# Patient Record
Sex: Female | Born: 1975 | Race: Black or African American | Hispanic: No | Marital: Married | Smoking: Never smoker
Health system: Southern US, Community
[De-identification: ages and names within clinical notes are randomized; demographics above are authoritative.]

## PROBLEM LIST (undated history)

## (undated) ENCOUNTER — Inpatient Hospital Stay (HOSPITAL_COMMUNITY): Payer: Self-pay

## (undated) DIAGNOSIS — Z789 Other specified health status: Secondary | ICD-10-CM

## (undated) DIAGNOSIS — B999 Unspecified infectious disease: Secondary | ICD-10-CM

## (undated) HISTORY — DX: Unspecified infectious disease: B99.9

---

## 2009-03-26 DIAGNOSIS — B999 Unspecified infectious disease: Secondary | ICD-10-CM

## 2009-03-26 HISTORY — DX: Unspecified infectious disease: B99.9

## 2009-09-28 ENCOUNTER — Ambulatory Visit (HOSPITAL_COMMUNITY): Admission: RE | Admit: 2009-09-28 | Discharge: 2009-09-28 | Payer: Self-pay | Admitting: Obstetrics & Gynecology

## 2010-02-24 ENCOUNTER — Ambulatory Visit (HOSPITAL_COMMUNITY)
Admission: RE | Admit: 2010-02-24 | Discharge: 2010-02-24 | Payer: Self-pay | Source: Home / Self Care | Admitting: Obstetrics & Gynecology

## 2010-03-06 ENCOUNTER — Ambulatory Visit (HOSPITAL_COMMUNITY)
Admission: RE | Admit: 2010-03-06 | Discharge: 2010-03-06 | Payer: Self-pay | Source: Home / Self Care | Attending: Obstetrics & Gynecology | Admitting: Obstetrics & Gynecology

## 2010-03-07 ENCOUNTER — Inpatient Hospital Stay (HOSPITAL_COMMUNITY)
Admission: AD | Admit: 2010-03-07 | Discharge: 2010-03-12 | Payer: Self-pay | Source: Home / Self Care | Attending: Obstetrics & Gynecology | Admitting: Obstetrics & Gynecology

## 2010-03-12 ENCOUNTER — Emergency Department (HOSPITAL_COMMUNITY)
Admission: EM | Admit: 2010-03-12 | Discharge: 2010-03-12 | Disposition: A | Discharge status: Other Acute Inpatient Hospital | Payer: Self-pay | Source: Home / Self Care | Admitting: Emergency Medicine

## 2010-03-12 ENCOUNTER — Inpatient Hospital Stay (HOSPITAL_COMMUNITY)
Admission: AD | Admit: 2010-03-12 | Discharge: 2010-03-15 | Payer: Self-pay | Attending: Obstetrics | Admitting: Obstetrics

## 2010-04-16 ENCOUNTER — Encounter: Payer: Self-pay | Admitting: Obstetrics & Gynecology

## 2010-06-05 LAB — CBC
HCT: 33.1 % — ABNORMAL LOW (ref 36.0–46.0)
Hemoglobin: 11.6 g/dL — ABNORMAL LOW (ref 12.0–15.0)
MCH: 32.2 pg (ref 26.0–34.0)
MCHC: 34 g/dL (ref 30.0–36.0)
MCHC: 33.5 g/dL (ref 30.0–36.0)
MCV: 94.7 fL (ref 78.0–100.0)
MCV: 95.4 fL (ref 78.0–100.0)
RBC: 3.6 MIL/uL — ABNORMAL LOW (ref 3.87–5.11)
RDW: 13.4 % (ref 11.5–15.5)
WBC: 12.5 10*3/uL — ABNORMAL HIGH (ref 4.0–10.5)
WBC: 4.8 10*3/uL (ref 4.0–10.5)

## 2010-06-05 LAB — URINE MICROSCOPIC-ADD ON

## 2010-06-05 LAB — GC/CHLAMYDIA PROBE AMP, GENITAL: Chlamydia, DNA Probe: NEGATIVE

## 2010-06-05 LAB — PROTIME-INR
INR: 1.06 (ref 0.00–1.49)
Prothrombin Time: 14 seconds (ref 11.6–15.2)

## 2010-06-05 LAB — URINE CULTURE: Culture  Setup Time: 201112190005

## 2010-06-05 LAB — CULTURE, BLOOD (ROUTINE X 2)

## 2010-06-05 LAB — COMPREHENSIVE METABOLIC PANEL
ALT: 55 U/L — ABNORMAL HIGH (ref 0–35)
AST: 90 U/L — ABNORMAL HIGH (ref 0–37)
Albumin: 2.2 g/dL — ABNORMAL LOW (ref 3.5–5.2)
CO2: 24 mEq/L (ref 19–32)
Chloride: 108 mEq/L (ref 96–112)
Creatinine, Ser: 0.69 mg/dL (ref 0.4–1.2)
Glucose, Bld: 86 mg/dL (ref 70–99)
Potassium: 3.6 mEq/L (ref 3.5–5.1)
Total Bilirubin: 0.8 mg/dL (ref 0.3–1.2)

## 2010-06-05 LAB — URINALYSIS, ROUTINE W REFLEX MICROSCOPIC
Bilirubin Urine: NEGATIVE
Ketones, ur: NEGATIVE mg/dL
Protein, ur: >300 mg/dL — AB

## 2010-06-05 LAB — DIFFERENTIAL
Basophils Absolute: 0 10*3/uL (ref 0.0–0.1)
Lymphocytes Relative: 13 % (ref 12–46)
Monocytes Absolute: 0.1 10*3/uL (ref 0.1–1.0)
Neutrophils Relative %: 84 % — ABNORMAL HIGH (ref 43–77)

## 2010-06-05 LAB — LACTIC ACID, PLASMA: Lactic Acid, Venous: 3.9 mmol/L — ABNORMAL HIGH (ref 0.5–2.2)

## 2010-06-05 LAB — WET PREP, GENITAL
Clue Cells Wet Prep HPF POC: NONE SEEN
Trich, Wet Prep: NONE SEEN

## 2010-06-06 LAB — COMPREHENSIVE METABOLIC PANEL
ALT: 13 U/L (ref 0–35)
AST: 21 U/L (ref 0–37)
Albumin: 3.3 g/dL — ABNORMAL LOW (ref 3.5–5.2)
Alkaline Phosphatase: 59 U/L (ref 39–117)
BUN: 6 mg/dL (ref 6–23)
CO2: 26 mEq/L (ref 19–32)
Chloride: 103 mEq/L (ref 96–112)
Creatinine, Ser: 0.6 mg/dL (ref 0.4–1.2)
Glucose, Bld: 76 mg/dL (ref 70–99)
Total Bilirubin: 0.8 mg/dL (ref 0.3–1.2)
Total Protein: 6.5 g/dL (ref 6.0–8.3)

## 2010-06-06 LAB — CBC
HCT: 39.2 % (ref 36.0–46.0)
HCT: 39.5 % (ref 36.0–46.0)
Hemoglobin: 13.4 g/dL (ref 12.0–15.0)
MCHC: 33.9 g/dL (ref 30.0–36.0)
MCV: 99.5 fL (ref 78.0–100.0)
Platelets: 234 10*3/uL (ref 150–400)
RBC: 3.94 MIL/uL (ref 3.87–5.11)
RBC: 3.97 MIL/uL (ref 3.87–5.11)
RDW: 13.5 % (ref 11.5–15.5)
WBC: 10.4 10*3/uL (ref 4.0–10.5)

## 2010-06-06 LAB — URINALYSIS, DIPSTICK ONLY
Bilirubin Urine: NEGATIVE
Nitrite: NEGATIVE
Specific Gravity, Urine: 1.01 (ref 1.005–1.030)
Urobilinogen, UA: 0.2 mg/dL (ref 0.0–1.0)
pH: 6.5 (ref 5.0–8.0)

## 2010-06-06 LAB — RPR: RPR Ser Ql: NONREACTIVE

## 2010-06-06 LAB — URIC ACID: Uric Acid, Serum: 3.7 mg/dL (ref 2.4–7.0)

## 2011-07-23 ENCOUNTER — Other Ambulatory Visit (HOSPITAL_COMMUNITY)
Admission: RE | Admit: 2011-07-23 | Discharge: 2011-07-23 | Disposition: A | Discharge status: Home / Self Care | Payer: 59 | Source: Ambulatory Visit | Attending: Family Medicine | Admitting: Family Medicine

## 2011-07-23 DIAGNOSIS — Z124 Encounter for screening for malignant neoplasm of cervix: Secondary | ICD-10-CM | POA: Insufficient documentation

## 2011-07-23 DIAGNOSIS — Z1159 Encounter for screening for other viral diseases: Secondary | ICD-10-CM | POA: Insufficient documentation

## 2011-09-06 ENCOUNTER — Encounter: Payer: Self-pay | Admitting: Obstetrics and Gynecology

## 2011-11-09 ENCOUNTER — Encounter: Payer: 59 | Admitting: Obstetrics and Gynecology

## 2011-11-13 ENCOUNTER — Encounter (HOSPITAL_COMMUNITY): Payer: Self-pay | Admitting: *Deleted

## 2011-11-13 ENCOUNTER — Inpatient Hospital Stay (HOSPITAL_COMMUNITY)
Admission: AD | Admit: 2011-11-13 | Discharge: 2011-11-13 | Disposition: A | Discharge status: Home / Self Care | Payer: 59 | Source: Ambulatory Visit | Attending: Obstetrics & Gynecology | Admitting: Obstetrics & Gynecology

## 2011-11-13 ENCOUNTER — Encounter: Payer: Self-pay | Admitting: Obstetrics and Gynecology

## 2011-11-13 ENCOUNTER — Inpatient Hospital Stay (HOSPITAL_COMMUNITY): Payer: 59

## 2011-11-13 DIAGNOSIS — O09529 Supervision of elderly multigravida, unspecified trimester: Secondary | ICD-10-CM | POA: Insufficient documentation

## 2011-11-13 DIAGNOSIS — Z98891 History of uterine scar from previous surgery: Secondary | ICD-10-CM | POA: Insufficient documentation

## 2011-11-13 DIAGNOSIS — O209 Hemorrhage in early pregnancy, unspecified: Secondary | ICD-10-CM | POA: Insufficient documentation

## 2011-11-13 DIAGNOSIS — O418X90 Other specified disorders of amniotic fluid and membranes, unspecified trimester, not applicable or unspecified: Secondary | ICD-10-CM

## 2011-11-13 HISTORY — DX: Other specified health status: Z78.9

## 2011-11-13 LAB — CBC
Platelets: 222 10*3/uL (ref 150–400)
RBC: 3.87 MIL/uL (ref 3.87–5.11)
RDW: 12.5 % (ref 11.5–15.5)
WBC: 7.9 10*3/uL (ref 4.0–10.5)

## 2011-11-13 LAB — POCT PREGNANCY, URINE: Preg Test, Ur: POSITIVE — AB

## 2011-11-13 LAB — HCG, QUANTITATIVE, PREGNANCY: hCG, Beta Chain, Quant, S: 68283 m[IU]/mL — ABNORMAL HIGH (ref <5)

## 2011-11-13 NOTE — MAU Note (Signed)
Was a work, felt a gush, no pain.  LMP 06/05.  First appt tomorrow at [facility].

## 2011-11-13 NOTE — MAU Provider Note (Signed)
History     CSN: [id number]  Arrival date and time: 11/13/11 0717   None     Chief Complaint  Patient presents with  . Vaginal Bleeding  . Threatened Miscarriage   HPI 36 y.o. G2P1001 at [redacted]w[redacted]d with vaginal bleeding this morning, was at work and felt gush of blood, no pain. Prior to today has had some intermittent episodes of pink spotting.     Past Medical History  Diagnosis Date  . No pertinent past medical history     Past Surgical History  Procedure Date  . Cesarean section     Family History  Problem Relation Age of Onset  . Other Neg Hx     History  Substance Use Topics  . Smoking status: Never Smoker   . Smokeless tobacco: Never Used  . Alcohol Use: No    Allergies: No Known Allergies  No prescriptions prior to admission    Review of Systems  Constitutional: Negative.   Respiratory: Negative.   Cardiovascular: Negative.   Gastrointestinal: Positive for nausea. Negative for vomiting, abdominal pain, diarrhea and constipation.  Genitourinary: Negative for dysuria, urgency, frequency, hematuria and flank pain.       Positive for vaginal bleeding   Musculoskeletal: Negative.   Neurological: Negative.   Psychiatric/Behavioral: Negative.    Physical Exam   Blood pressure 132/67, pulse 81, temperature 98.4 F (36.9 C), resp. rate 20, last menstrual period 08/29/2011.  Physical Exam  Nursing note and vitals reviewed. Constitutional: She is oriented to person, place, and time. She appears well-developed and well-nourished. No distress.  Cardiovascular: Normal rate.   Respiratory: Effort normal.  Genitourinary: There is bleeding (moderate) around the vagina.       Cervix long and closed   Musculoskeletal: Normal range of motion.  Neurological: She is alert and oriented to person, place, and time.  Skin: Skin is warm and dry.  Psychiatric: She has a normal mood and affect.    MAU Course  Procedures Results for orders placed during the  hospital encounter of 11/13/11 (from the past 24 hour(s))  POCT PREGNANCY, URINE     Status: Abnormal   Collection Time   11/13/11  7:32 AM      Component Value Range   Preg Test, Ur POSITIVE (*) NEGATIVE  CBC     Status: Abnormal   Collection Time   11/13/11  8:00 AM      Component Value Range   WBC 7.9  4.0 - 10.5 K/uL   RBC 3.87  3.87 - 5.11 MIL/uL   Hemoglobin 12.3  12.0 - 15.0 g/dL   HCT 40.9 (*) 81.1 - 91.4 %   MCV 92.8  78.0 - 100.0 fL   MCH 31.8  26.0 - 34.0 pg   MCHC 34.3  30.0 - 36.0 g/dL   RDW 78.2  95.6 - 21.3 %   Platelets 222  150 - 400 K/uL  ABO/RH     Status: Normal   Collection Time   11/13/11  8:00 AM      Component Value Range   ABO/RH(D) O POS    HCG, QUANTITATIVE, PREGNANCY     Status: Abnormal   Collection Time   11/13/11  8:00 AM      Component Value Range   hCG, Beta Chain, Quant, S 08657 (*) <5 mIU/mL   U/S: 11.5 wk IUP, FHR 170, + subchorionic hemorrhage measuring 4 cm  Assessment and Plan  36 y.o. G2P1001 at [redacted]w[redacted]d Bleeding r/t subchorionic hemorrhage Rev'd  precautions, pelvic rest F/U at scheduled new OB appt tomorrow at [facility] 11/13/2011, 9:53 AM

## 2011-11-13 NOTE — MAU Note (Signed)
Assisted to bathroom from lobby via wc.  Pt reports a 'gush' this morning.  Has appt for first pnv scheduled- is "2wks".

## 2011-11-13 NOTE — MAU Provider Note (Signed)
Attestation of Attending Supervision of Advanced Practitioner (CNM/NP): Evaluation and management procedures were performed by the Advanced Practitioner under my supervision and collaboration.  I have reviewed the Advanced Practitioner's note and chart, and I agree with the management and plan.  Dreonna Hussein, MD, FACOG Attending Obstetrician & Gynecologist Faculty Practice, [facility], [place]  

## 2011-11-14 ENCOUNTER — Ambulatory Visit (INDEPENDENT_AMBULATORY_CARE_PROVIDER_SITE_OTHER): Payer: 59 | Admitting: Obstetrics and Gynecology

## 2011-11-14 ENCOUNTER — Encounter: Payer: Self-pay | Admitting: Obstetrics and Gynecology

## 2011-11-14 DIAGNOSIS — Z331 Pregnant state, incidental: Secondary | ICD-10-CM

## 2011-11-14 DIAGNOSIS — Z3201 Encounter for pregnancy test, result positive: Secondary | ICD-10-CM

## 2011-11-14 LAB — OB RESULTS CONSOLE GBS
GBS: NEGATIVE
GBS: POSITIVE

## 2011-11-15 LAB — PRENATAL PANEL VII
Antibody Screen: NEGATIVE
Basophils Absolute: 0 10*3/uL (ref 0.0–0.1)
Eosinophils Relative: 0 % (ref 0–5)
HIV: NONREACTIVE
Hepatitis B Surface Ag: NEGATIVE
Lymphocytes Relative: 23 % (ref 12–46)
Lymphs Abs: 1.8 10*3/uL (ref 0.7–4.0)
Neutro Abs: 5.7 10*3/uL (ref 1.7–7.7)
Neutrophils Relative %: 71 % (ref 43–77)
Platelets: 246 10*3/uL (ref 150–400)
RBC: 3.6 MIL/uL — ABNORMAL LOW (ref 3.87–5.11)
RDW: 13.6 % (ref 11.5–15.5)
WBC: 8.1 10*3/uL (ref 4.0–10.5)

## 2011-11-16 ENCOUNTER — Encounter: Payer: Self-pay | Admitting: Obstetrics and Gynecology

## 2011-11-16 DIAGNOSIS — O2340 Unspecified infection of urinary tract in pregnancy, unspecified trimester: Secondary | ICD-10-CM | POA: Insufficient documentation

## 2011-11-16 DIAGNOSIS — B951 Streptococcus, group B, as the cause of diseases classified elsewhere: Secondary | ICD-10-CM | POA: Insufficient documentation

## 2011-11-16 LAB — HEMOGLOBINOPATHY EVALUATION
Hemoglobin Other: 0 %
Hgb F Quant: 3.3 % — ABNORMAL HIGH (ref 0.0–2.0)
Hgb S Quant: 0 %

## 2011-11-16 LAB — CULTURE, OB URINE

## 2011-11-19 ENCOUNTER — Ambulatory Visit (INDEPENDENT_AMBULATORY_CARE_PROVIDER_SITE_OTHER): Payer: 59 | Admitting: Obstetrics and Gynecology

## 2011-11-19 ENCOUNTER — Encounter: Payer: Self-pay | Admitting: Obstetrics and Gynecology

## 2011-11-19 VITALS — BP 110/62 | Wt 192.0 lb

## 2011-11-19 DIAGNOSIS — B951 Streptococcus, group B, as the cause of diseases classified elsewhere: Secondary | ICD-10-CM

## 2011-11-19 DIAGNOSIS — Z331 Pregnant state, incidental: Secondary | ICD-10-CM

## 2011-11-19 DIAGNOSIS — Z98891 History of uterine scar from previous surgery: Secondary | ICD-10-CM

## 2011-11-19 DIAGNOSIS — O09529 Supervision of elderly multigravida, unspecified trimester: Secondary | ICD-10-CM

## 2011-11-19 DIAGNOSIS — Z9889 Other specified postprocedural states: Secondary | ICD-10-CM

## 2011-11-19 DIAGNOSIS — A491 Streptococcal infection, unspecified site: Secondary | ICD-10-CM

## 2011-11-19 DIAGNOSIS — Z113 Encounter for screening for infections with a predominantly sexual mode of transmission: Secondary | ICD-10-CM

## 2011-11-19 LAB — POCT URINALYSIS DIPSTICK
Bilirubin, UA: NEGATIVE
Nitrite, UA: NEGATIVE
pH, UA: 6

## 2011-11-19 MED ORDER — PENICILLIN V POTASSIUM 500 MG PO TABS: 500.0000 mg | ORAL_TABLET | Freq: Three times a day (TID) | ORAL | Status: AC

## 2011-11-19 NOTE — Progress Notes (Signed)
Pt c/o nausea and vomitting. Also has a brownish vag d/c. Angela Phelps

## 2011-11-20 ENCOUNTER — Encounter: Payer: Self-pay | Admitting: Obstetrics and Gynecology

## 2011-11-20 NOTE — Progress Notes (Signed)
Patient ID: Angela Phelps, female   DOB: 10-Sep-1975, 36 y.o.   MRN: [id number]  SUKHMANI Phelps is here for a new obstetrical visit.  Denies vag bleeding, taking pnv, no N,V, declines genetic testing. [redacted]w[redacted]d Problem list: Patient Active Problem List  Diagnosis  . H/O cesarean section  . AMA (advanced maternal age) multigravida 35+  . GBS (group B streptococcus) UTI complicating pregnancy  . Large for gestational age (LGA)    @IPILAPH @ OB History    Grav Para Term Preterm Abortions TAB SAB Ect Mult Living   2 1 1  0 0 0 0 0 0 1     Past Medical History  Diagnosis Date  . No pertinent past medical history   . Infection 2011    UTI after C/S   . Infection 2011    Yeast inf;during last prenancy   Past Surgical History  Procedure Date  . Cesarean section 2011    d/t failure to progress after 9 cm   Family History: family history is negative for Other. Social History:  reports that she has never smoked. She has never used smokeless tobacco. She reports that she does not drink alcohol or use illicit drugs.    Blood pressure 110/62, weight 192 lb (87.091 kg), last menstrual period 08/29/2011, unknown if currently breastfeeding.  Physical exam: Calm, no distress Alert, appropriate appearance for age. No acute distress HEENT Grossly normal Thyroid no masses, nodes or enlargement  lungs clear bilaterally, AP RRR, breasts bilaterally no masses, dimpling, drainage, abd soft, gravid, nt, bowel sounds active no edema to lower extremities EGBUS and perineum wnl, normal hair distribution Vagina pink moist Cervix LTC no cerical motion tenderness, cervical polyp with scant blood noted White discharge appears adequate FHTS +  Prenatal labs: ABO, Rh: O/POS/-- (08/21 1444) Antibody: NEG (08/21 1444) Rubella:  immune RPR: NON REAC (08/21 1444)  HBsAg: NEGATIVE (08/21 1444)  HIV: NON REACTIVE (08/21 1444)  GBS:   + urine  Assessment/Plan: [redacted]w[redacted]d Hx subchorionic hemorrhage with  [place] 8/20 Wet prep: neg trich, hyphae neg, clue neg Pap: on 4/13 sent for record Genetic screen: declined +GBS urine RX Pen VK discussed with f/o UA C&S.  Collaboration with Dr. Normand Sloop. [facility], Tykia Mellone 11/20/2011, 4:46 PM Lavera Guise, CNM

## 2011-11-28 ENCOUNTER — Other Ambulatory Visit: Payer: Self-pay | Admitting: Obstetrics and Gynecology

## 2011-11-28 MED ORDER — CITRANATAL HARMONY 30-1-260 MG PO CAPS: 1.0000 | ORAL_CAPSULE | Freq: Every day | ORAL | Status: DC

## 2011-11-28 NOTE — Telephone Encounter (Signed)
Tc from pt per telephone call. Pt req rf for Citranatal [place]. Rx e-pres to pharm on file. Pt agrees.

## 2011-11-28 NOTE — Telephone Encounter (Signed)
Lm on vm to cb per telephone call.  

## 2011-11-28 NOTE — Telephone Encounter (Signed)
TRIAGE/RX REQ °

## 2011-12-17 ENCOUNTER — Ambulatory Visit (INDEPENDENT_AMBULATORY_CARE_PROVIDER_SITE_OTHER): Payer: 59 | Admitting: Obstetrics and Gynecology

## 2011-12-17 ENCOUNTER — Encounter: Payer: Self-pay | Admitting: Obstetrics and Gynecology

## 2011-12-17 VITALS — BP 116/70 | Wt 193.0 lb

## 2011-12-17 DIAGNOSIS — Z331 Pregnant state, incidental: Secondary | ICD-10-CM

## 2011-12-17 NOTE — Progress Notes (Signed)
[redacted]w[redacted]d Pt states no problems today.

## 2011-12-17 NOTE — Progress Notes (Signed)
[redacted]w[redacted]d No c/o Has some N/V but not frequent, requests generic for PNV, also could take OTC PNV  rv'd nutrition  RTO 4wks anatomy scan

## 2011-12-17 NOTE — Patient Instructions (Signed)
Preventing Preterm Labor Preterm labor is when a pregnant woman has contractions that cause the cervix to open, shorten, and thin before 37 weeks of pregnancy. You will have regular contractions (tightening) 2 to 3 minutes apart. This usually causes discomfort or pain. HOME CARE  Eat a healthy diet.   Take your vitamins as told by your doctor.   Drink enough fluids to keep your pee (urine) clear or pale yellow every day.   Get rest and sleep.   Do not have sex if you are at high risk for preterm labor.   Follow your doctor's advice about activity, medicines, and tests.   Avoid stress.   Avoid hard labor or exercise that lasts for a long time.   Do not smoke.  GET HELP RIGHT AWAY IF:   You are having contractions.   You have belly (abdominal) pain.   You have bleeding from your vagina.   You have pain when you pee (urinate).   You have abnormal discharge from your vagina.   You have a temperature by mouth above 102 F (38.9 C).  MAKE SURE YOU:  Understand these instructions.   Will watch your condition.   Will get help if you are not doing well or get worse.  Document Released: 06/08/2008 Document Revised: 03/01/2011 Document Reviewed: 06/08/2008 [employer] Patient Information 2012 [employer]. 

## 2012-01-14 ENCOUNTER — Other Ambulatory Visit: Payer: Self-pay | Admitting: Obstetrics and Gynecology

## 2012-01-14 ENCOUNTER — Ambulatory Visit (INDEPENDENT_AMBULATORY_CARE_PROVIDER_SITE_OTHER): Payer: 59 | Admitting: Obstetrics and Gynecology

## 2012-01-14 ENCOUNTER — Encounter: Payer: Self-pay | Admitting: Obstetrics and Gynecology

## 2012-01-14 ENCOUNTER — Ambulatory Visit (INDEPENDENT_AMBULATORY_CARE_PROVIDER_SITE_OTHER): Payer: 59

## 2012-01-14 VITALS — BP 112/72 | Ht 64.0 in | Wt 200.0 lb

## 2012-01-14 DIAGNOSIS — Z3689 Encounter for other specified antenatal screening: Secondary | ICD-10-CM

## 2012-01-14 DIAGNOSIS — O4420 Partial placenta previa NOS or without hemorrhage, unspecified trimester: Secondary | ICD-10-CM | POA: Insufficient documentation

## 2012-01-14 DIAGNOSIS — O09529 Supervision of elderly multigravida, unspecified trimester: Secondary | ICD-10-CM

## 2012-01-14 DIAGNOSIS — Z331 Pregnant state, incidental: Secondary | ICD-10-CM

## 2012-01-14 LAB — US OB DETAIL + 14 WK

## 2012-01-14 LAB — US OB TRANSVAGINAL

## 2012-01-14 LAB — CBC
Platelets: 265 10*3/uL (ref 150–400)
RBC: 3.54 MIL/uL — ABNORMAL LOW (ref 3.87–5.11)
WBC: 6.2 10*3/uL (ref 4.0–10.5)

## 2012-01-14 NOTE — Progress Notes (Signed)
Pt stated no issues today.  [redacted]w[redacted]d EFW(B,H,F) Hadi: 13 oz +/-0.92oz 53%tile (381grams) Vertex presentation Anterior plencenta. No marginal previa at this time . Suggest follow up at 2 weeks GAGA/EDD cx is closed normal ovaries

## 2012-01-14 NOTE — Progress Notes (Signed)
Patient ID: Angela Phelps, female   DOB: 06-10-1975, 36 y.o.   MRN: [id number] [redacted]w[redacted]d 1 gtt today F/o [place] 6 weeks Marginal previa discussed risks bleeding, no vag exam and no intercouse discussed. Bleeding to report. Lavera Guise, CNM

## 2012-01-15 LAB — GLUCOSE TOLERANCE, 1 HOUR (50G) W/O FASTING: Glucose, 1 Hour GTT: 123 mg/dL (ref 70–140)

## 2012-01-29 IMAGING — US US FETAL BPP W/O NONSTRESS
1 series · 6 of 6 positions shown · non-contrast
Comparison: none

[Series 1: us fetal bpp w/o nonstress · non-contrast · 6 acquisitions, 6 frames shown]
[im 1/6]
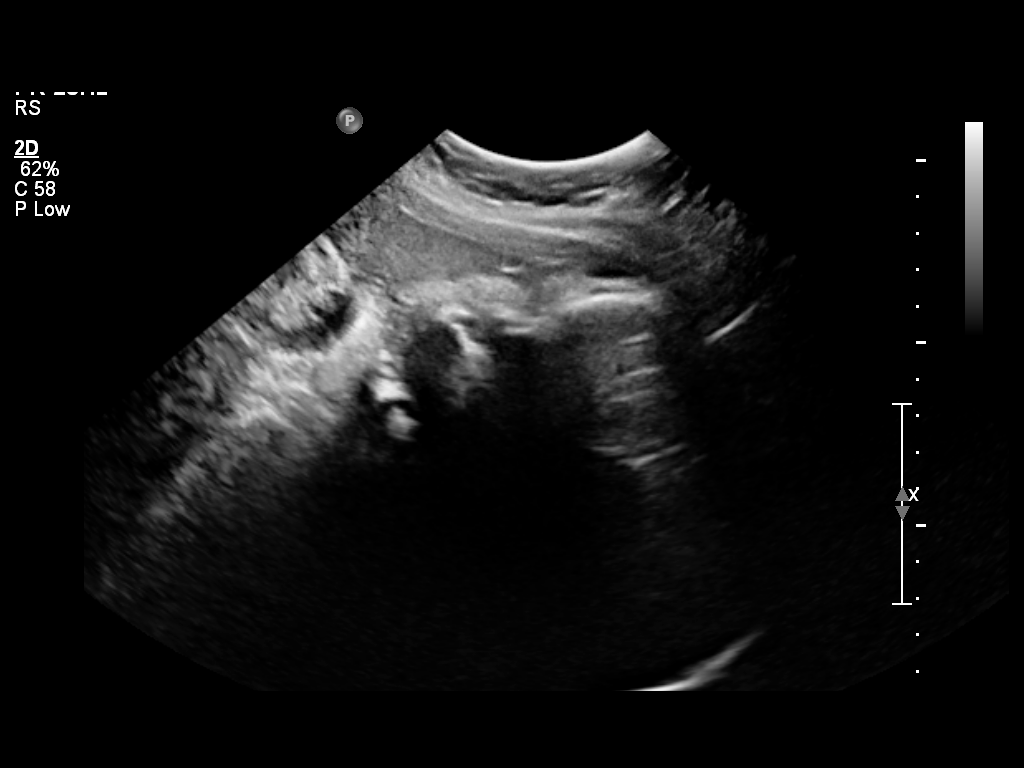
[im 2/6]
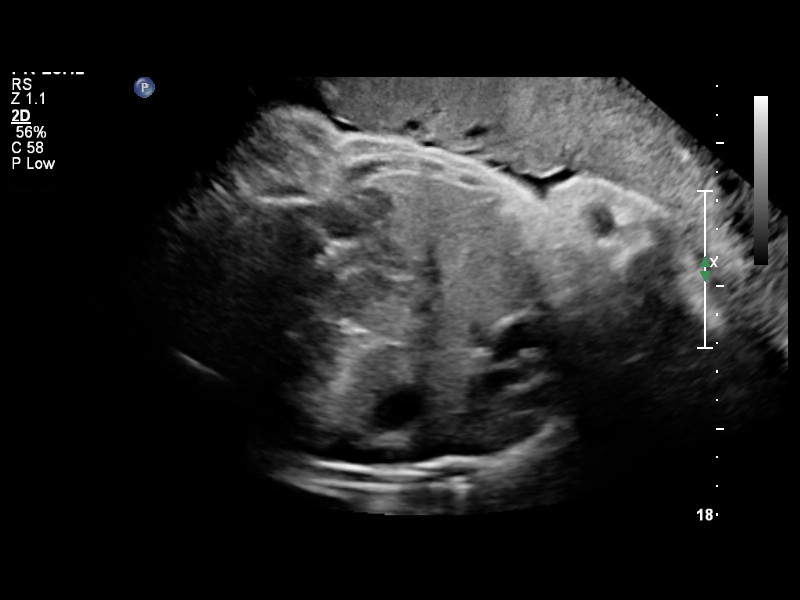
[im 3/6]
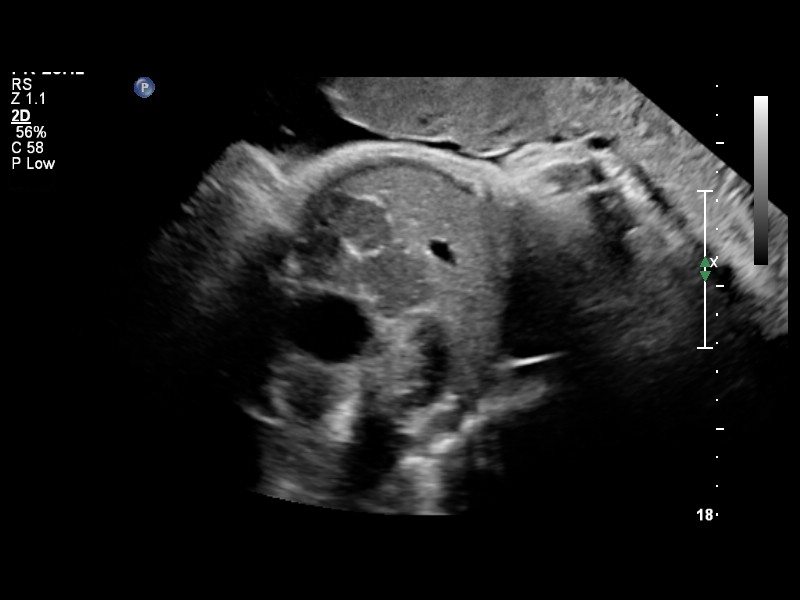
[im 4/6]
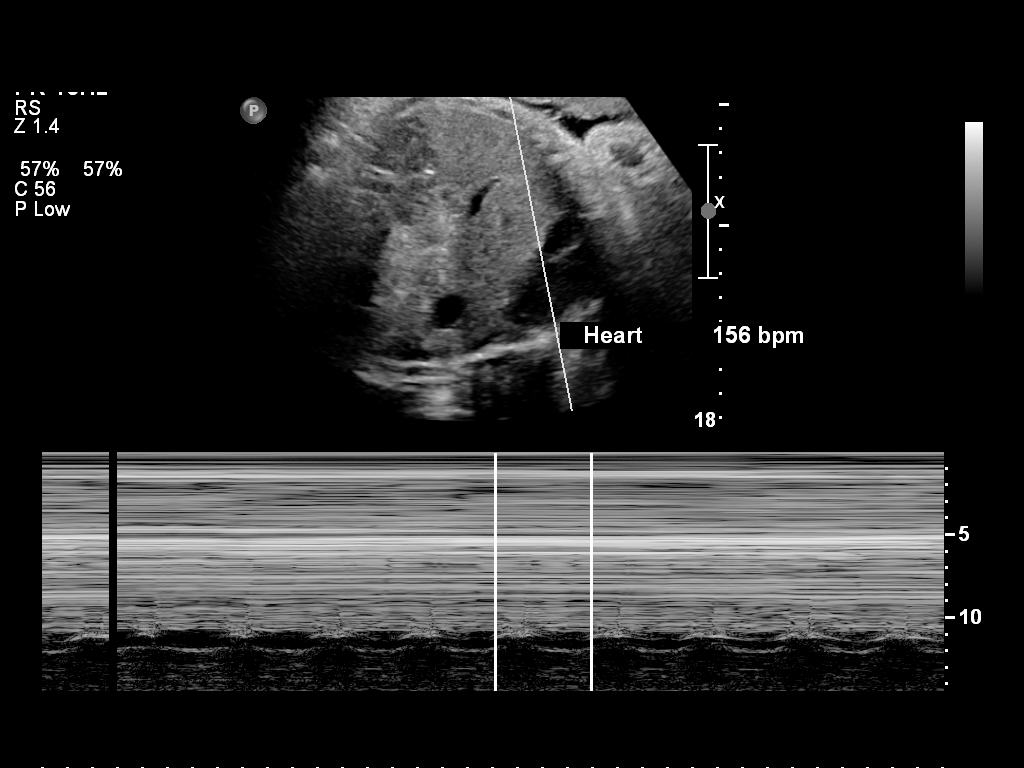
[im 5/6]
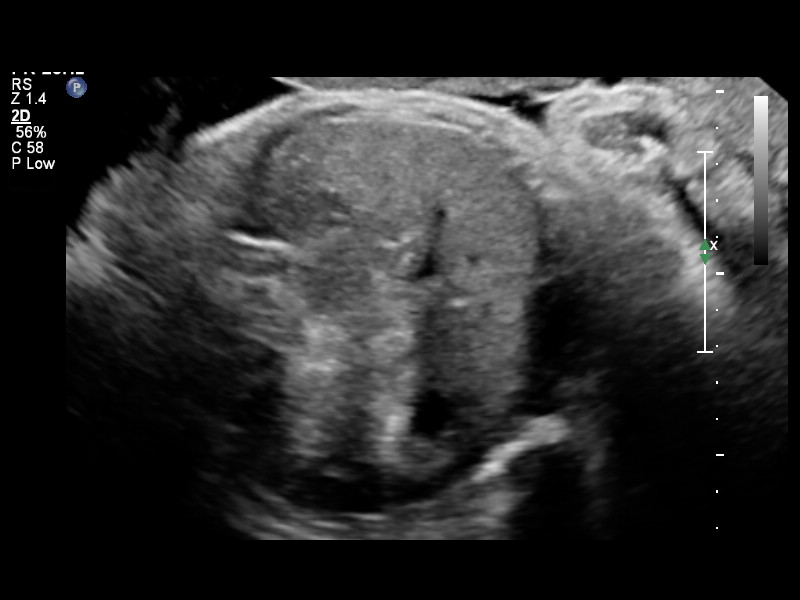
[im 6/6]
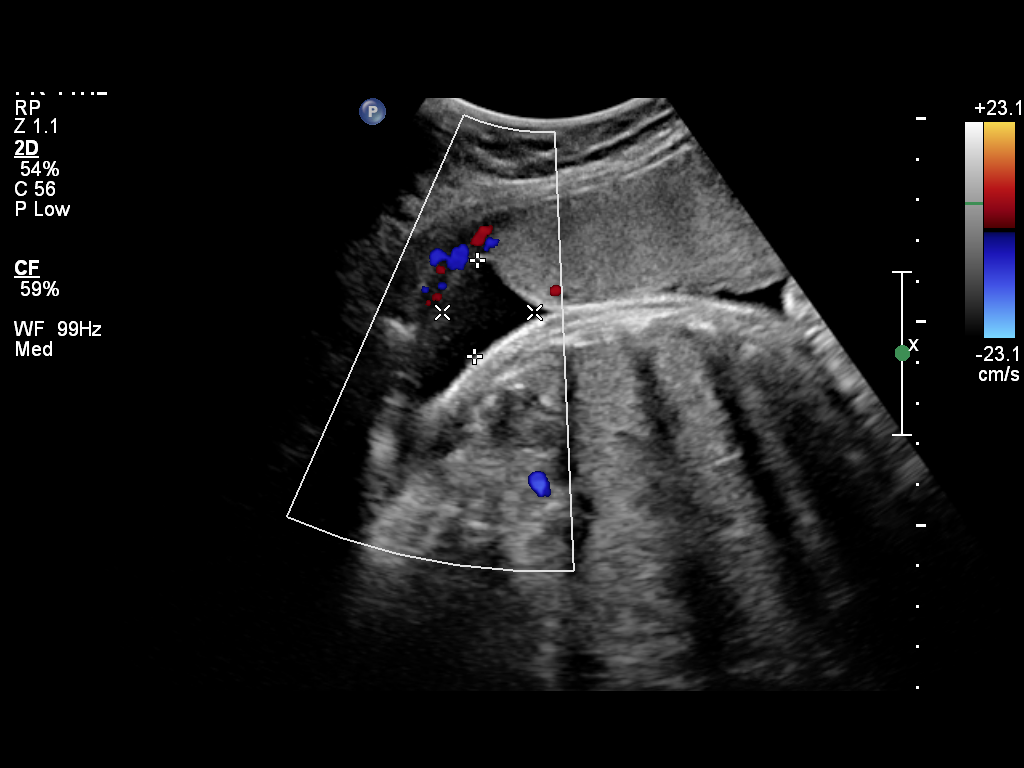

[6 of 6 positions shown; findings below may reference images not displayed]

OBSTETRICS REPORT
                      (Signed Final 03/06/2010 [DATE])

 Order#:         _O
Procedures

 US FETAL BPP W/O NONSTRESS                            76819.0
Indications

 Postdate pregnancy (40-42 weeks)
 Assess fetal well being
Fetal Evaluation

 Fetal Heart Rate:  156                          bpm
 Cardiac Activity:  Observed
 Presentation:      Cephalic

 Amniotic Fluid
 AFI FV:      Subjectively within normal limits
Biophysical Evaluation

 Amniotic F.V:   Pocket => 2 cm two         F. Tone:        Observed
                 planes
 F. Movement:    Observed                   Score:          [DATE]
 F. Breathing:   Observed
Gestational Age

 LMP:           41w 2d        Date:  05/21/09                 EDD:   02/25/10
 Clinical EDD:  41w 5d                                        EDD:   02/22/10
 Best:          41w 5d     Det. By:  Clinical EDD             EDD:   02/22/10
Impression

 Single living IUP with assigned GA of 41w 5d.
 BPP is [DATE].
 Normal amniotic fluid volume.

## 2012-02-11 ENCOUNTER — Ambulatory Visit (INDEPENDENT_AMBULATORY_CARE_PROVIDER_SITE_OTHER): Payer: 59 | Admitting: Obstetrics and Gynecology

## 2012-02-11 VITALS — BP 110/62 | Wt 201.0 lb

## 2012-02-11 DIAGNOSIS — Z98891 History of uterine scar from previous surgery: Secondary | ICD-10-CM

## 2012-02-11 DIAGNOSIS — Z9889 Other specified postprocedural states: Secondary | ICD-10-CM

## 2012-02-11 DIAGNOSIS — Z331 Pregnant state, incidental: Secondary | ICD-10-CM

## 2012-02-11 DIAGNOSIS — O09529 Supervision of elderly multigravida, unspecified trimester: Secondary | ICD-10-CM

## 2012-02-11 DIAGNOSIS — B951 Streptococcus, group B, as the cause of diseases classified elsewhere: Secondary | ICD-10-CM

## 2012-02-11 DIAGNOSIS — Z3490 Encounter for supervision of normal pregnancy, unspecified, unspecified trimester: Secondary | ICD-10-CM

## 2012-02-11 DIAGNOSIS — Z23 Encounter for immunization: Secondary | ICD-10-CM

## 2012-02-11 DIAGNOSIS — O2390 Unspecified genitourinary tract infection in pregnancy, unspecified trimester: Secondary | ICD-10-CM

## 2012-02-11 DIAGNOSIS — N390 Urinary tract infection, site not specified: Secondary | ICD-10-CM

## 2012-02-11 NOTE — Progress Notes (Signed)
[redacted]w[redacted]d Pt complains if right sided pain off and on.  Maybe every other day.  Had single episode of vomiting yesterday, none today.  Call for perisitent pain 1hr glucola done on 01/14/2012--normal at 123/rpr non reactive/. Repeat U/S at [facility] to followup placental placement Repeat 1 hr glucola at 30 wks VBAC consent given, pt to read and return it at [facility] Flu vaccine today

## 2012-02-27 ENCOUNTER — Telehealth: Payer: Self-pay | Admitting: Obstetrics and Gynecology

## 2012-02-28 NOTE — Telephone Encounter (Signed)
Tc to pt regarding msg.  Lm on vm to call back. 

## 2012-02-29 ENCOUNTER — Telehealth: Payer: Self-pay | Admitting: Obstetrics and Gynecology

## 2012-02-29 NOTE — Telephone Encounter (Signed)
Spoke with pt rgd msg pt wants to know what she can take for a cough informed pt can take plain robitussin,plain mucinexs, or plain delsym pt voice understanding

## 2012-03-07 ENCOUNTER — Other Ambulatory Visit: Payer: Self-pay

## 2012-03-07 DIAGNOSIS — O26849 Uterine size-date discrepancy, unspecified trimester: Secondary | ICD-10-CM

## 2012-03-10 ENCOUNTER — Ambulatory Visit (INDEPENDENT_AMBULATORY_CARE_PROVIDER_SITE_OTHER): Payer: 59 | Admitting: Obstetrics and Gynecology

## 2012-03-10 ENCOUNTER — Encounter: Payer: Self-pay | Admitting: Obstetrics and Gynecology

## 2012-03-10 ENCOUNTER — Ambulatory Visit (INDEPENDENT_AMBULATORY_CARE_PROVIDER_SITE_OTHER): Payer: 59

## 2012-03-10 VITALS — BP 110/60 | Wt 208.0 lb

## 2012-03-10 DIAGNOSIS — O4420 Partial placenta previa NOS or without hemorrhage, unspecified trimester: Secondary | ICD-10-CM

## 2012-03-10 DIAGNOSIS — O4410 Placenta previa with hemorrhage, unspecified trimester: Secondary | ICD-10-CM

## 2012-03-10 DIAGNOSIS — O26849 Uterine size-date discrepancy, unspecified trimester: Secondary | ICD-10-CM

## 2012-03-10 DIAGNOSIS — Z9889 Other specified postprocedural states: Secondary | ICD-10-CM

## 2012-03-10 DIAGNOSIS — Z98891 History of uterine scar from previous surgery: Secondary | ICD-10-CM

## 2012-03-10 NOTE — Progress Notes (Signed)
[redacted]w[redacted]d No complaints H/o Prim LTCS with 2 layer closure wants VBAC - consent signed today R/B/A reviewed U/S 2lbs 13oz 76.5%, AFI 16.49cm, low lying placenta 1.2 cm from internal os - rec repeat at 32-34wks RTO 2wks and repeat glucola that day

## 2012-03-17 LAB — US OB FOLLOW UP

## 2012-03-26 NOTE — L&D Delivery Note (Signed)
Delivery Note  At 5:55 PM a healthy female was delivered via VBAC, Spontaneous (Presentation: Left Occiput Anterior).  APGAR: 8, 9; weight 6 lb 14.6 oz (3135 g).   Placenta status: Intact, Spontaneous.  Cord: 3 vessels with the following complications: None.  Anesthesia: None  Episiotomy: None Lacerations: 3rd degree Suture Repair: 2.0 vicryl rapide Est. Blood Loss (mL): 400   Mom to postpartum.   Baby A to skin to skin.   Baby B to NA.  Kirkland Hun V 05/14/2012, 6:46 PM

## 2012-03-31 ENCOUNTER — Encounter: Payer: Self-pay | Admitting: Obstetrics and Gynecology

## 2012-03-31 ENCOUNTER — Other Ambulatory Visit: Payer: Self-pay

## 2012-03-31 ENCOUNTER — Other Ambulatory Visit: Payer: 59

## 2012-03-31 ENCOUNTER — Ambulatory Visit (INDEPENDENT_AMBULATORY_CARE_PROVIDER_SITE_OTHER): Payer: 59 | Admitting: Obstetrics and Gynecology

## 2012-03-31 VITALS — BP 108/70 | Wt 210.0 lb

## 2012-03-31 DIAGNOSIS — O09529 Supervision of elderly multigravida, unspecified trimester: Secondary | ICD-10-CM

## 2012-03-31 DIAGNOSIS — Z3480 Encounter for supervision of other normal pregnancy, unspecified trimester: Secondary | ICD-10-CM

## 2012-03-31 DIAGNOSIS — O4440 Low lying placenta NOS or without hemorrhage, unspecified trimester: Secondary | ICD-10-CM

## 2012-03-31 DIAGNOSIS — Z9889 Other specified postprocedural states: Secondary | ICD-10-CM

## 2012-03-31 DIAGNOSIS — Z98891 History of uterine scar from previous surgery: Secondary | ICD-10-CM

## 2012-03-31 DIAGNOSIS — O4410 Placenta previa with hemorrhage, unspecified trimester: Secondary | ICD-10-CM

## 2012-03-31 NOTE — Progress Notes (Signed)
[redacted]w[redacted]d U/s in 2wks to f/u low lying placenta and EFW secondary to LGA Glucola today FKCs RTO 2wks Occas RUQ discomfort with movement.  Likely MSK, notify office if worsens.

## 2012-04-15 ENCOUNTER — Ambulatory Visit: Payer: 59

## 2012-04-15 ENCOUNTER — Ambulatory Visit: Payer: 59 | Admitting: Obstetrics and Gynecology

## 2012-04-15 ENCOUNTER — Encounter: Payer: Self-pay | Admitting: Obstetrics and Gynecology

## 2012-04-15 VITALS — BP 112/74 | Wt 214.0 lb

## 2012-04-15 DIAGNOSIS — O4440 Low lying placenta NOS or without hemorrhage, unspecified trimester: Secondary | ICD-10-CM

## 2012-04-15 DIAGNOSIS — Z3490 Encounter for supervision of normal pregnancy, unspecified, unspecified trimester: Secondary | ICD-10-CM

## 2012-04-15 LAB — US OB FOLLOW UP

## 2012-04-15 NOTE — Progress Notes (Signed)
[redacted]w[redacted]d Pt c/o being more tired with this pregnancy, but otherwise feeling fine Discussed [place] results Low lying placenta has now resolved Discussed VBAC options -- consent has been signed Pt had questions about FMLA - told her to speak with HR at her work Will return in 2 weeks F/u US @ 38 weeks for growth - H/o of LGA

## 2012-04-15 NOTE — Progress Notes (Signed)
Pt stated had a cold last week been feeling much better. Pt stated no issues today.  Ultrasound shows:  SIUP  S=D     [place] EDD: 05/29/2012           EFW: 5 lb 0oz           Cervical length: 3.83 cm           Placenta localization: anterior           Fetal presentation: vertex                   Anatomy survey is normal           Gender : female Comments: Fluid is normal AFI =65th% Resolved low lying placenta (placental edge is 6.1 cm from internal os-normal. Normal ovaries,no fluid in cds, normal adenaxas.

## 2012-05-01 ENCOUNTER — Ambulatory Visit: Payer: 59 | Admitting: Obstetrics and Gynecology

## 2012-05-01 ENCOUNTER — Encounter: Payer: Self-pay | Admitting: Obstetrics and Gynecology

## 2012-05-01 VITALS — BP 90/68 | Wt 216.0 lb

## 2012-05-01 DIAGNOSIS — Z331 Pregnant state, incidental: Secondary | ICD-10-CM

## 2012-05-01 NOTE — Progress Notes (Signed)
[redacted]w[redacted]d +GBS via urine  No concerns today per pt

## 2012-05-01 NOTE — Progress Notes (Signed)
Reviewed previous US .  No change in decision for VBAC. Marland Kitchen Declines follow up US for EFW as it will not affect her decision. Vtx. By leopolds.

## 2012-05-07 ENCOUNTER — Encounter: Payer: 59 | Admitting: Obstetrics and Gynecology

## 2012-05-09 ENCOUNTER — Ambulatory Visit: Payer: 59 | Admitting: Obstetrics and Gynecology

## 2012-05-09 ENCOUNTER — Other Ambulatory Visit: Payer: Self-pay | Admitting: Obstetrics and Gynecology

## 2012-05-09 ENCOUNTER — Encounter: Payer: Self-pay | Admitting: Obstetrics and Gynecology

## 2012-05-09 VITALS — BP 122/62 | Wt 223.0 lb

## 2012-05-09 DIAGNOSIS — Z331 Pregnant state, incidental: Secondary | ICD-10-CM

## 2012-05-09 NOTE — Progress Notes (Signed)
.  [redacted]w[redacted]d No complaints today. Pt states that she had creamy like discharge on past Wed, does not want to be checked.

## 2012-05-09 NOTE — Patient Instructions (Signed)
Group B Streptococcus Infection During Pregnancy Group B streptococcus (GBS) is a type of bacteria often found in healthy women. GBS usually does not cause any symptoms or harm to healthy adult women, but the bacteria can make a baby very sick if it is passed to the baby during childbirth. GBS is not a sexually transmitted disease (STD). GBS is different from Group A streptococcus, the bacteria that causes "strep throat." CAUSES  GBS bacteria can be found in the intestinal, reproductive, and urinary tracts of women. It can also be found in the female genital tract, most often in the vagina and rectal areas.  SYMPTOMS  In pregnancy, GBS can be in the following places:  Genital tract without symptoms.  Rectum without symptoms.  Urine with or without symptoms (asymptomatic bacteriuria).  Urinary symptoms can include pain, frequency, urgency, and blood with urination (cystitis). Pregnant women who are infected with GBS are at increased risk of:  Early (premature) labor and delivery.  Prolonged rupture of the membranes.  Infection in the following places:  Bladder.  Kidneys (pyelonephritis).  Bag of waters or placenta (chorioamnionitis).  Uterus (endometritis) after delivery. Newborns who are infected with GBS can develop:  Lung infection (pneumonia).  Blood infection (septicemia).  Infection of the lining of the brain and spinal cord (meningitis). DIAGNOSIS  Diagnosis of GBS infection is made by screening tests done when you are 35 to [redacted] weeks pregnant. The test (culture) is an easy swab of the vagina and rectum. A sample of your urine might also be checked for the bacteria. Talk with your caregiver about a plan for labor if your test shows that you carry the GBS bacteria. TREATMENT  If results of the culture are positive, showing that GBS is present, you likely will receive treatment with antibiotic medicines during labor. This will help prevent GBS from being passed to your baby.  The antibiotics work only if they are given during labor. If treatment is given earlier in pregnancy, the bacteria may regrow and be present during labor. Tell your caregiver if you are allergic to penicillin or other antibiotics. Antibiotics are also given if:   Infection of the membranes (amnionitis) is suspected.  Labor begins or there is rupture of the membranes before 37 weeks of pregnancy and there is a high risk of delivering the baby.  The mother has a past history of giving birth to an infant with GBS infection.  The culture status is unknown (culture not performed or result not available) and there is:  Fever during labor.  Preterm labor (less than 37 weeks of pregnancy).  Prolonged rupture of membranes (18 hours or more). Treatment of the mother during labor is not recommended when:  A planned cesarean delivery is done and there is no labor or ruptured membranes. This is true even if the mother tested positive for GBS.  There is a negative culture for GBS screening during the pregnancy, regardless of the risk factors during labor. The infant will receive antibiotics if the infant tested positive for GBS or has signs and symptoms that suggest GBS infection is present. It is not recommended to routinely give antibiotics to infants whose mothers received antibiotic treatment during labor. HOME CARE INSTRUCTIONS   Take all antibiotics as prescribed by your caregiver.  Only take medicine as directed by your caregiver.  Continue with prenatal visits and care.  Return for follow-up appointments and cultures.  Follow your caregiver's instructions. SEEK MEDICAL CARE IF:   You have pain with urination.    You have frequent urination.  You have blood in your urine. SEEK IMMEDIATE MEDICAL CARE IF:  You have a fever.  You have pain in the back, side, or uterus.  You have chills.  You have abdominal swelling (distension) or pain.  You have labor pains (contractions) every  10 minutes or more often.  You are leaking fluid or bleeding from your vagina.  You have pelvic pressure that feels like your baby is pushing down.  You have a low, dull backache.  You have cramps that feel like your period.  You have abdominal cramps with or without diarrhea.  You have repeated vomiting and diarrhea.  You have trouble breathing.  You develop confusion.  You have stiffness of your body or neck. MAKE SURE YOU:   Understand these instructions.  Will watch your condition.  Will get help right away if you are not doing well or get worse. Document Released: 06/19/2007 Document Revised: 06/04/2011 Document Reviewed: 07/23/2010 [facility] Patient Information 2013 [place], [place].

## 2012-05-09 NOTE — Progress Notes (Signed)
[redacted]w[redacted]d A/P GBS positive in urine.  tx in labor Fetal kick counts reviewed Labor reviewed with pt All patients  questions answered US@[facility]  s>D

## 2012-05-12 ENCOUNTER — Encounter: Payer: 59 | Admitting: Obstetrics and Gynecology

## 2012-05-14 ENCOUNTER — Telehealth: Payer: Self-pay

## 2012-05-14 ENCOUNTER — Encounter (HOSPITAL_COMMUNITY): Payer: Self-pay | Admitting: *Deleted

## 2012-05-14 ENCOUNTER — Inpatient Hospital Stay (HOSPITAL_COMMUNITY)
Admission: AD | Admit: 2012-05-14 | Discharge: 2012-05-16 | DRG: 775 | Disposition: A | Discharge status: Home / Self Care | Payer: 59 | Source: Ambulatory Visit | Attending: Obstetrics and Gynecology | Admitting: Obstetrics and Gynecology

## 2012-05-14 DIAGNOSIS — O34219 Maternal care for unspecified type scar from previous cesarean delivery: Secondary | ICD-10-CM | POA: Diagnosis present

## 2012-05-14 DIAGNOSIS — O09529 Supervision of elderly multigravida, unspecified trimester: Secondary | ICD-10-CM | POA: Diagnosis present

## 2012-05-14 DIAGNOSIS — O99892 Other specified diseases and conditions complicating childbirth: Secondary | ICD-10-CM | POA: Diagnosis present

## 2012-05-14 DIAGNOSIS — Z22330 Carrier of Group B streptococcus: Secondary | ICD-10-CM

## 2012-05-14 LAB — CBC
MCH: 30 pg (ref 26.0–34.0)
MCHC: 32.8 g/dL (ref 30.0–36.0)
Platelets: 266 10*3/uL (ref 150–400)
RDW: 15 % (ref 11.5–15.5)

## 2012-05-14 LAB — TYPE AND SCREEN
ABO/RH(D): O POS
Antibody Screen: NEGATIVE

## 2012-05-14 LAB — RPR: RPR Ser Ql: NONREACTIVE

## 2012-05-14 MED ORDER — LACTATED RINGERS IV SOLN
INTRAVENOUS | Status: DC
  Administered 2012-05-14: 16:00:00 via INTRAVENOUS

## 2012-05-14 MED ORDER — OXYTOCIN 40 UNITS IN LACTATED RINGERS INFUSION - SIMPLE MED
62.5000 mL/h | INTRAVENOUS | Status: DC
  Administered 2012-05-14: 62.5 mL/h via INTRAVENOUS
  Filled 2012-05-14: qty 1000

## 2012-05-14 MED ORDER — HYDROXYZINE HCL 50 MG/ML IM SOLN: 50.0000 mg | Freq: Four times a day (QID) | INTRAMUSCULAR | Status: DC | PRN

## 2012-05-14 MED ORDER — FLEET ENEMA 7-19 GM/118ML RE ENEM: 1.0000 | ENEMA | RECTAL | Status: DC | PRN

## 2012-05-14 MED ORDER — BENZOCAINE-MENTHOL 20-0.5 % EX AERO
1.0000 "application " | INHALATION_SPRAY | CUTANEOUS | Status: DC | PRN
  Administered 2012-05-14 – 2012-05-16 (×2): 1 via TOPICAL
  Filled 2012-05-14 (×3): qty 56

## 2012-05-14 MED ORDER — DIBUCAINE 1 % RE OINT
1.0000 "application " | TOPICAL_OINTMENT | RECTAL | Status: DC | PRN
  Filled 2012-05-14: qty 28

## 2012-05-14 MED ORDER — CITRIC ACID-SODIUM CITRATE 334-500 MG/5ML PO SOLN: 30.0000 mL | ORAL | Status: DC | PRN

## 2012-05-14 MED ORDER — ONDANSETRON HCL 4 MG/2ML IJ SOLN: 4.0000 mg | INTRAMUSCULAR | Status: DC | PRN

## 2012-05-14 MED ORDER — LIDOCAINE HCL (PF) 1 % IJ SOLN: 30.0000 mL | INTRAMUSCULAR | Status: DC | PRN

## 2012-05-14 MED ORDER — LACTATED RINGERS IV SOLN: 500.0000 mL | INTRAVENOUS | Status: DC | PRN

## 2012-05-14 MED ORDER — BUTORPHANOL TARTRATE 1 MG/ML IJ SOLN
1.0000 mg | INTRAMUSCULAR | Status: DC | PRN
  Administered 2012-05-14: 1 mg via INTRAVENOUS
  Filled 2012-05-14: qty 1

## 2012-05-14 MED ORDER — SODIUM CHLORIDE 0.9 % IV SOLN
2.0000 g | Freq: Once | INTRAVENOUS | Status: AC
  Administered 2012-05-14: 2 g via INTRAVENOUS
  Filled 2012-05-14: qty 2000

## 2012-05-14 MED ORDER — ZOLPIDEM TARTRATE 5 MG PO TABS: 5.0000 mg | ORAL_TABLET | Freq: Every evening | ORAL | Status: DC | PRN

## 2012-05-14 MED ORDER — PRENATAL MULTIVITAMIN CH
1.0000 | ORAL_TABLET | Freq: Every day | ORAL | Status: DC
  Administered 2012-05-15 – 2012-05-16 (×2): 1 via ORAL
  Filled 2012-05-14 (×2): qty 1

## 2012-05-14 MED ORDER — ONDANSETRON HCL 4 MG PO TABS: 4.0000 mg | ORAL_TABLET | ORAL | Status: DC | PRN

## 2012-05-14 MED ORDER — HYDROXYZINE HCL 50 MG PO TABS: 50.0000 mg | ORAL_TABLET | Freq: Four times a day (QID) | ORAL | Status: DC | PRN

## 2012-05-14 MED ORDER — SIMETHICONE 80 MG PO CHEW: 80.0000 mg | CHEWABLE_TABLET | ORAL | Status: DC | PRN

## 2012-05-14 MED ORDER — OXYTOCIN BOLUS FROM INFUSION
500.0000 mL | INTRAVENOUS | Status: DC
Start: 2012-05-14 — End: 2012-05-14

## 2012-05-14 MED ORDER — ONDANSETRON HCL 4 MG/2ML IJ SOLN: 4.0000 mg | Freq: Four times a day (QID) | INTRAMUSCULAR | Status: DC | PRN

## 2012-05-14 MED ORDER — IBUPROFEN 600 MG PO TABS
600.0000 mg | ORAL_TABLET | Freq: Four times a day (QID) | ORAL | Status: DC | PRN
  Administered 2012-05-14: 600 mg via ORAL
  Filled 2012-05-14: qty 1

## 2012-05-14 MED ORDER — WITCH HAZEL-GLYCERIN EX PADS
1.0000 "application " | MEDICATED_PAD | CUTANEOUS | Status: DC | PRN
  Administered 2012-05-16: 1 via TOPICAL

## 2012-05-14 MED ORDER — MEASLES, MUMPS & RUBELLA VAC ~~LOC~~ INJ: 0.5000 mL | INJECTION | Freq: Once | SUBCUTANEOUS | Status: DC

## 2012-05-14 MED ORDER — DIPHENHYDRAMINE HCL 25 MG PO CAPS: 25.0000 mg | ORAL_CAPSULE | Freq: Four times a day (QID) | ORAL | Status: DC | PRN

## 2012-05-14 MED ORDER — OXYCODONE-ACETAMINOPHEN 5-325 MG PO TABS: 1.0000 | ORAL_TABLET | ORAL | Status: DC | PRN

## 2012-05-14 MED ORDER — TETANUS-DIPHTH-ACELL PERTUSSIS 5-2.5-18.5 LF-MCG/0.5 IM SUSP
0.5000 mL | Freq: Once | INTRAMUSCULAR | Status: DC
  Filled 2012-05-14: qty 0.5

## 2012-05-14 MED ORDER — ACETAMINOPHEN 325 MG PO TABS: 650.0000 mg | ORAL_TABLET | ORAL | Status: DC | PRN

## 2012-05-14 MED ORDER — LIDOCAINE HCL (PF) 1 % IJ SOLN
30.0000 mL | INTRAMUSCULAR | Status: AC | PRN
  Administered 2012-05-14: 30 mL via SUBCUTANEOUS
  Administered 2012-05-14: 60 mL via SUBCUTANEOUS
  Filled 2012-05-14 (×4): qty 30

## 2012-05-14 MED ORDER — SENNOSIDES-DOCUSATE SODIUM 8.6-50 MG PO TABS
2.0000 | ORAL_TABLET | Freq: Every day | ORAL | Status: DC
  Administered 2012-05-14 – 2012-05-15 (×2): 2 via ORAL

## 2012-05-14 MED ORDER — MEDROXYPROGESTERONE ACETATE 150 MG/ML IM SUSP: 150.0000 mg | INTRAMUSCULAR | Status: DC | PRN

## 2012-05-14 MED ORDER — LANOLIN HYDROUS EX OINT: TOPICAL_OINTMENT | CUTANEOUS | Status: DC | PRN

## 2012-05-14 MED ORDER — IBUPROFEN 600 MG PO TABS
600.0000 mg | ORAL_TABLET | Freq: Four times a day (QID) | ORAL | Status: DC
  Administered 2012-05-15 – 2012-05-16 (×7): 600 mg via ORAL
  Filled 2012-05-14 (×7): qty 1

## 2012-05-14 NOTE — Telephone Encounter (Signed)
Spoke with pt rgd concerns states having contractions since 10 am every now every 2-3 mins some bleeding no leaking fluid positive fetal movement advised pt to head to MAU for eval AVS informed pt voice understanding

## 2012-05-14 NOTE — H&P (Signed)
Admission History and Physical Exam for an Obstetrics Patient  [name] is a 37 y.o. female, G3P1001, at [redacted]w[redacted]d gestation, who presents for uterine contractions. She has been followed at the [facility] and [facility]cology division of [facility].  Her pregnancy has been complicated by a history of a prior cesarean delivery for failure to progress. Her age is greater than 35. She declines genetic testing. See history below.  OB History   Grav Para Term Preterm Abortions TAB SAB Ect Mult Living   3 1 1  0 0 0 0 0 0 1      Past Medical History  Diagnosis Date  . No pertinent past medical history   . Infection 2011    UTI after C/S   . Infection 2011    Yeast inf;during last prenancy    Prescriptions prior to admission  Medication Sig Dispense Refill  . ondansetron (ZOFRAN) 4 MG tablet       . Prenat w/o A-FeCbn-DSS-FA-DHA (CITRANATAL HARMONY) 29-1-265 MG CAPS TAKE 1 CAPSULE BY MOUTH DAILY.  30 capsule  12    Past Surgical History  Procedure Laterality Date  . Cesarean section  2011    d/t failure to progress after 9 cm    No Known Allergies  Family History: family history is negative for Other.  Social History:  reports that she has never smoked. She has never used smokeless tobacco. She reports that she does not drink alcohol or use illicit drugs.  Review of systems: Normal pregnancy complaints.  Admission Physical Exam:  Dilation: Fingertip Effacement (%): 40 Station: -2 Exam by:: lee There is no weight on file to calculate BMI.  Blood pressure 125/70, pulse 80, resp. rate 20, last menstrual period 08/29/2011, unknown if currently breastfeeding.  HEENT:                 Within normal limits Chest:                   Clear Heart:                    Regular rate and rhythm Abdomen:             Gravid and nontender Extremities:          Grossly normal Neurologic exam: Grossly normal Pelvic exam:         Cervix: 1.5  cm  Prenatal labs: ABO, Rh:             O/POS/-- (08/21 1444) Antibody:              NEG (08/21 1444) Rubella:                 Immune RPR:                    NON REAC (10/21 1240)  HBsAg:                 NEGATIVE (08/21 1444)  HIV:                       NON REACTIVE (08/21 1444)  GBS:                     Positive (08/21 0000)   Assessment:  [redacted]w[redacted]d gestation  Uterine contractions  Prior cesarean section for failure to progress in labor   desires a vaginal birth after cesarean section  Advanced  maternal age  Beta strep in urine  Plan:  Will observe for active labor.  We'll give pain medication through her IV line for now.  She will need penicillin prophylaxis.  The patient wants to try a vaginal birth after cesarean section.   Makayla Lanter V 05/14/2012, 3:35 PM

## 2012-05-15 DIAGNOSIS — O34219 Maternal care for unspecified type scar from previous cesarean delivery: Secondary | ICD-10-CM | POA: Diagnosis present

## 2012-05-15 LAB — CBC
Hemoglobin: 9.2 g/dL — ABNORMAL LOW (ref 12.0–15.0)
Platelets: 258 10*3/uL (ref 150–400)
RBC: 3.08 MIL/uL — ABNORMAL LOW (ref 3.87–5.11)
WBC: 12.1 10*3/uL — ABNORMAL HIGH (ref 4.0–10.5)

## 2012-05-15 NOTE — Progress Notes (Signed)
Post Partum Day 1 Subjective: no complaints, up ad lib, voiding, tolerating PO and + flatus  Objective: Blood pressure 114/66, pulse 70, temperature 98.5 F (36.9 C), temperature source Oral, resp. rate 17, height 5\' 5"  (1.651 m), weight 208 lb (94.348 kg), last menstrual period 08/29/2011, SpO2 100.00%, unknown if currently breastfeeding.  Physical Exam:  General: alert, cooperative, appears stated age and no distress Lochia: appropriate Uterine Fundus: firm Incision: Not applicable DVT Evaluation: No evidence of DVT seen on physical exam.   Recent Labs  05/14/12 1545 05/15/12 0545  HGB 11.8* 9.2*  HCT 36.0 27.8*    Assessment/Plan: Plan for discharge tomorrow and Contraception Undecided Planning outpatient circle for her son   LOS: 1 day   Samyrah Bruster P 05/15/2012, 9:49 AM

## 2012-05-16 ENCOUNTER — Other Ambulatory Visit: Payer: 59

## 2012-05-16 ENCOUNTER — Encounter: Payer: 59 | Admitting: Family Medicine

## 2012-05-16 MED ORDER — IBUPROFEN 600 MG PO TABS: 600.0000 mg | ORAL_TABLET | Freq: Four times a day (QID) | ORAL | Status: DC

## 2012-05-16 MED ORDER — OXYCODONE-ACETAMINOPHEN 5-325 MG PO TABS: 1.0000 | ORAL_TABLET | ORAL | Status: DC | PRN

## 2012-05-16 NOTE — Discharge Summary (Signed)
Obstetric Discharge Summary  Reason for Admission: onset of labor Prenatal Procedures: none Intrapartum Procedures: VBAC  by Marline Backbone MD Postpartum Procedures: none Complications-Operative and Postpartum: none  Hemoglobin  Date Value Range Status  05/15/2012 9.2* 12.0 - 15.0 g/dL Final     DELTA CHECK NOTED     REPEATED TO VERIFY     HCT  Date Value Range Status  05/15/2012 27.8* 36.0 - 46.0 % Final    Discharge Diagnoses: Term Pregnancy-delivered  Discharge Information: reviewed normal recovery after VBAC. Declines contraception for now.  Date: 05/16/2012 Activity: unrestricted Diet: routine Medications: Ibuprofen and Percocet Condition: stable  Breastfeeding: yes  Instructions: refer to practice specific booklet Discharge to: home   Newborn Data: Live born  Information for the patient's newborn:  Kourtlynn, Trevor [[id number]]  female  Home with mother.  Haylee Mcanany A MD 05/16/2012, 12:18 PM

## 2012-05-19 ENCOUNTER — Telehealth (HOSPITAL_COMMUNITY): Payer: Self-pay | Admitting: *Deleted

## 2012-05-19 ENCOUNTER — Telehealth: Payer: Self-pay | Admitting: Obstetrics and Gynecology

## 2012-05-19 NOTE — Telephone Encounter (Signed)
Resolve episode 

## 2012-07-08 ENCOUNTER — Encounter: Payer: Self-pay | Admitting: Certified Nurse Midwife

## 2012-07-28 ENCOUNTER — Other Ambulatory Visit (HOSPITAL_COMMUNITY)
Admission: RE | Admit: 2012-07-28 | Discharge: 2012-07-28 | Disposition: A | Discharge status: Home / Self Care | Payer: 59 | Source: Ambulatory Visit | Attending: Family Medicine | Admitting: Family Medicine

## 2012-07-28 ENCOUNTER — Other Ambulatory Visit: Payer: Self-pay | Admitting: Family Medicine

## 2012-07-28 DIAGNOSIS — Z124 Encounter for screening for malignant neoplasm of cervix: Secondary | ICD-10-CM | POA: Insufficient documentation

## 2012-07-28 DIAGNOSIS — R1011 Right upper quadrant pain: Secondary | ICD-10-CM

## 2012-08-04 ENCOUNTER — Ambulatory Visit
Admission: RE | Admit: 2012-08-04 | Discharge: 2012-08-04 | Disposition: A | Discharge status: Home / Self Care | Payer: 59 | Source: Ambulatory Visit | Attending: Family Medicine | Admitting: Family Medicine

## 2012-08-04 DIAGNOSIS — R1011 Right upper quadrant pain: Secondary | ICD-10-CM

## 2013-01-29 ENCOUNTER — Other Ambulatory Visit: Payer: Self-pay

## 2014-01-25 ENCOUNTER — Encounter (HOSPITAL_COMMUNITY): Payer: Self-pay | Admitting: *Deleted

## 2014-03-26 NOTE — L&D Delivery Note (Signed)
Vaginal Delivery Note The pt arrived in [facility]U at 1205 in second stage labor.  She reports she SROM'd at 1000 at home, clear.    As I walked in the room at 1208 I observed spontaneous self directed pushing with Illene Bolus CNM at the bedside.  As I was donning my gloves the head, shoulders and the body of a viable female infant "unsure" delivered spontaneously with maternal effort in the ROA position at 1208.   Tight [place] x 1, difficult to reduced because of the rapid descent of the infant t/f the infant somersaulted thru without difficulty.   With vigorous tone and spontaneous cry, the infant was placed on moms abd.  After the umbilical cord was clamped it was cut then cord blood was obtained for evaluation.  Spontaneous delivery of a intact placenta with a 3 vessel cord via [place] at 1229.   Episiotomy: None   The vulva, perineum, vaginal vault, rectum and cervix were inspected and revealed a 2 degree perineal, repaired using a 3-0 vicryl on a CT needle and periclitoral laceration which was repaired using a 3-0 vicryl on a SH needle with 20cc of 1% lidocaine.   IM postpartum pitocin as ordered.  Fundus firm, lochia minimum, bleeding under control. EBL 100, Pt hemodynamically stable.   Sponge, laps and needle count correct and verified with the primary care nurse.  Attending MD available at all times.    Routine postpartum orders   Mother desires no contraception   Infant to have out patient circumcision   Placenta to pathology: NO     Cord Gases sent to lab: NO Cord blood sent to lab: YES   APGARS:  8 at 1 minute and 9 at 5 minutes Weight:. pending    Pt reports she was too tired to support and/or hold the baby on her chest.  The baby was taken to the NBN, FOB went with them.      Lilleigh Hechavarria, CNM, MSN 12/09/2014. 1:27 PM

## 2014-06-29 IMAGING — US US ABDOMEN COMPLETE
1 series · 14 of 25 positions shown · non-contrast
Comparison: No comparison studies available.

CLINICAL DATA: Right upper quadrant pain

ABDOMEN ULTRASOUND
TECHNIQUE: Complete abdominal ultrasound examination was performed
including evaluation of the liver, gallbladder, bile ducts,
pancreas, kidneys, spleen, IVC, and abdominal aorta.

[Series 1: us abdomen complete · 0.24mm/px · 14 of 79 slices shown]
[im 1/79]
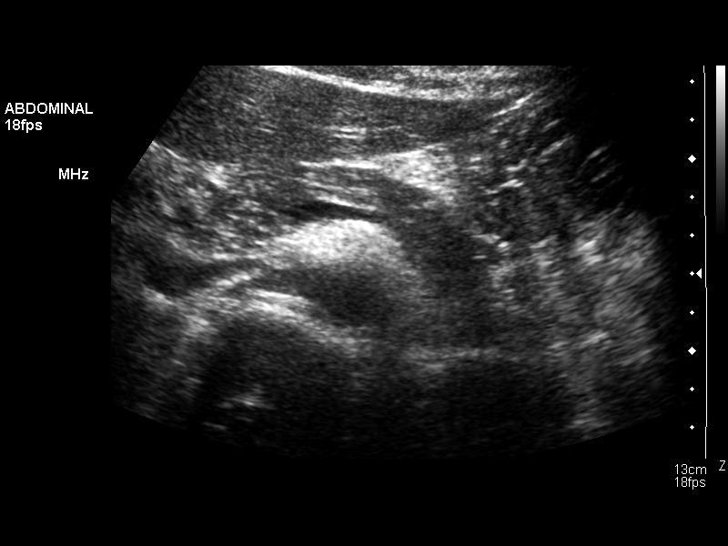
[im 7/79]
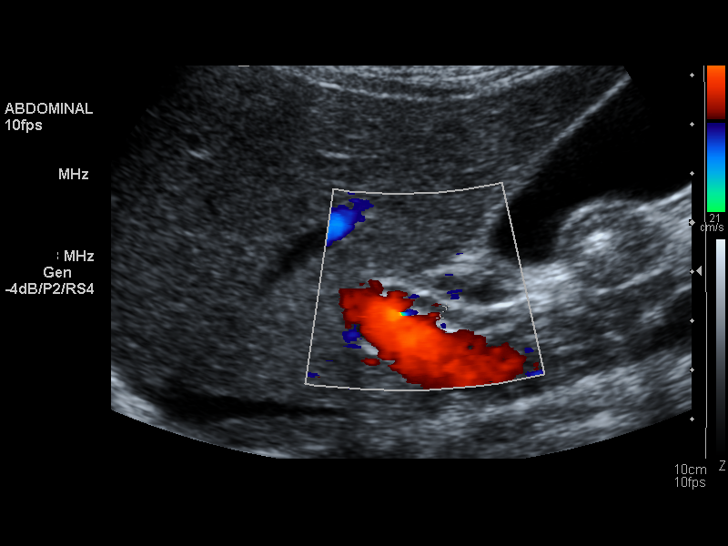
[im 14/79]
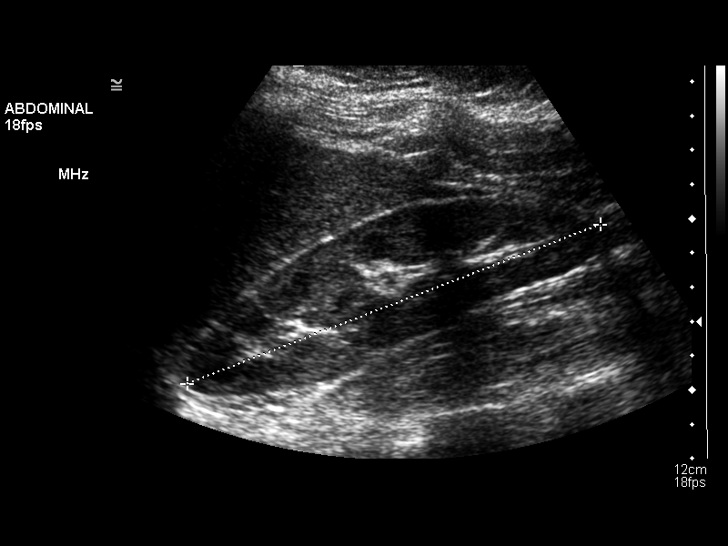
[im 20/79]
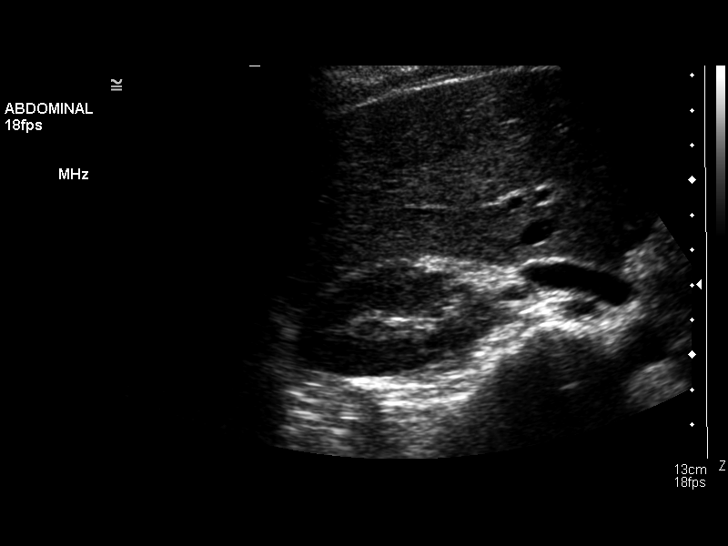
[im 27/79]
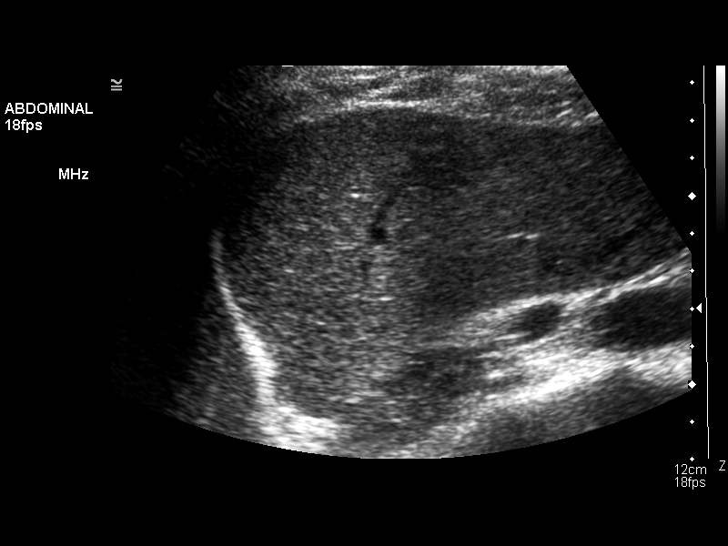
[im 30/79]
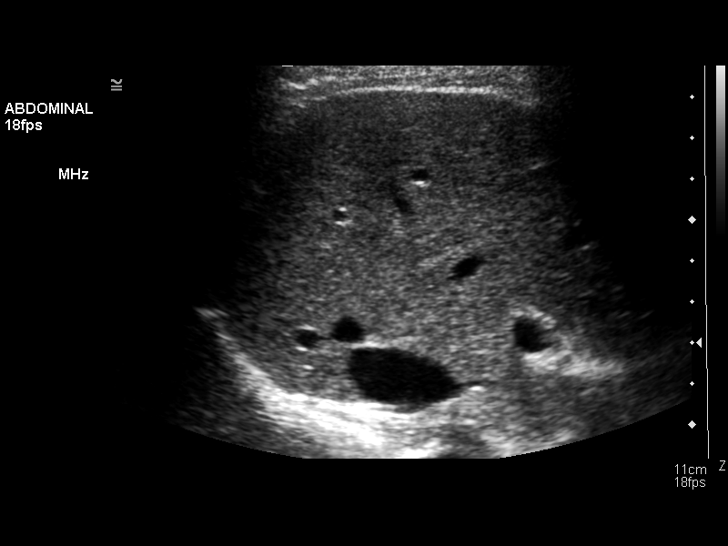
[im 36/79]
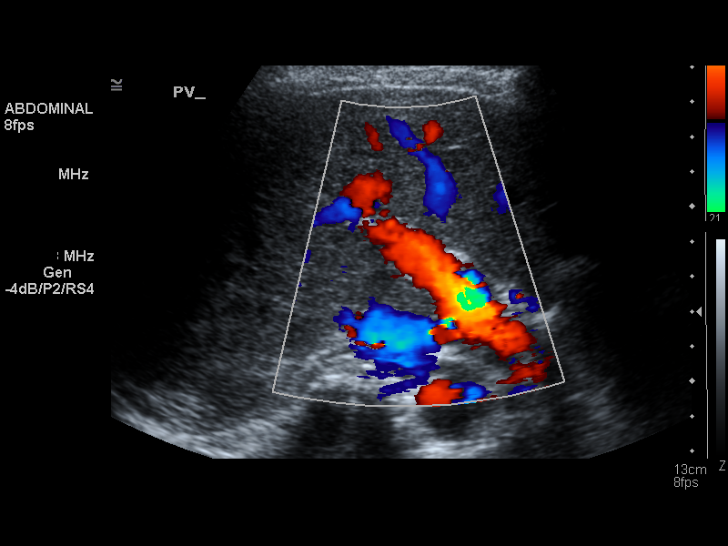
[im 43/79]
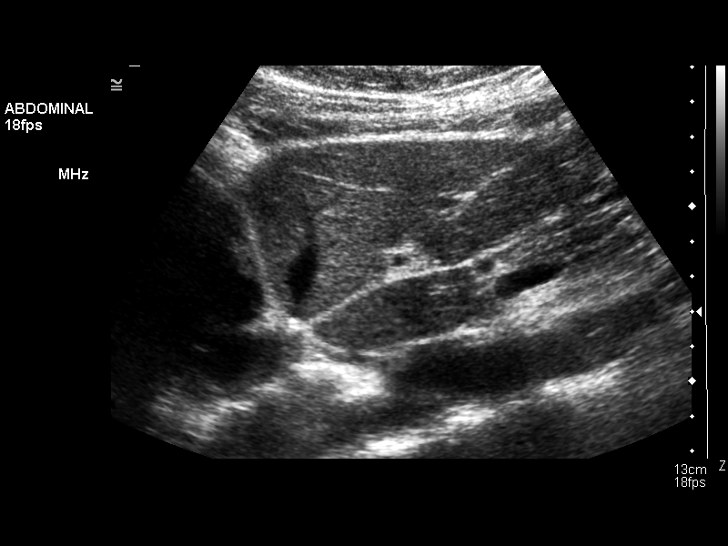
[im 49/79]
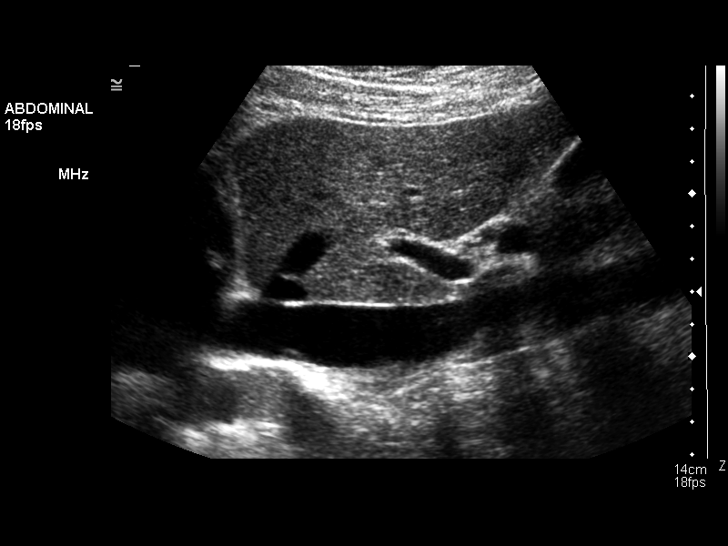
[im 53/79]
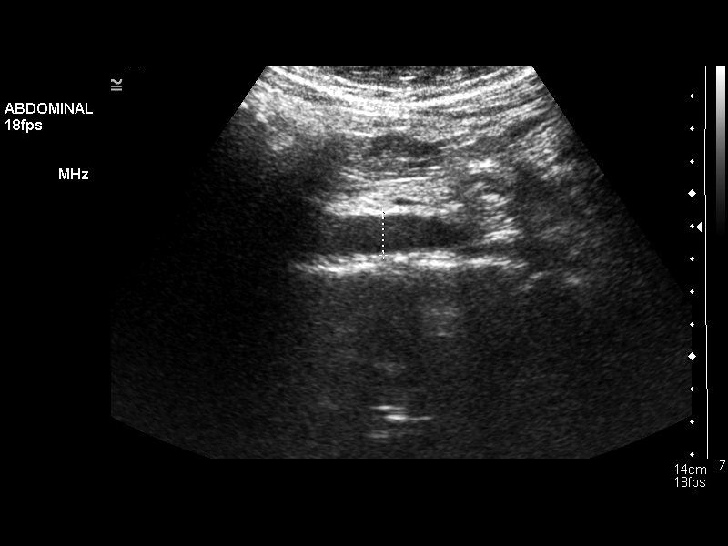
[im 59/79]
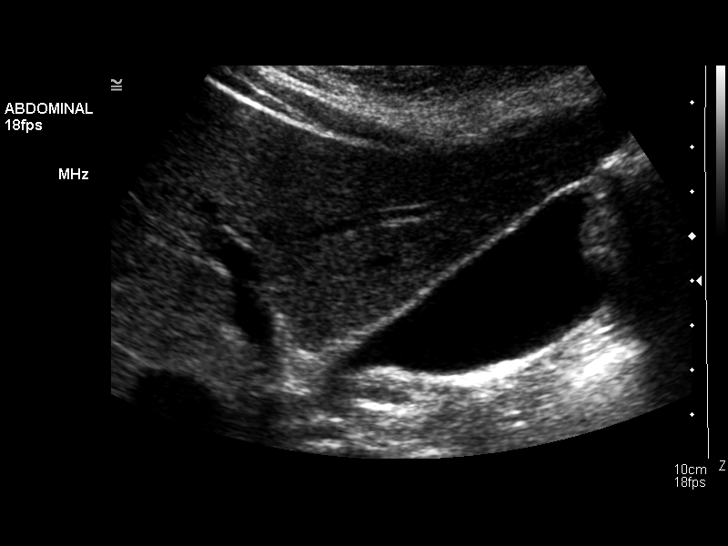
[im 66/79]
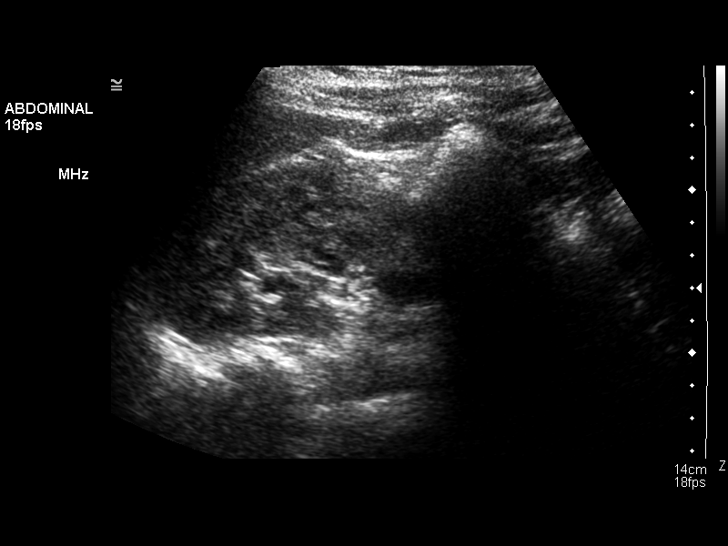
[im 72/79]
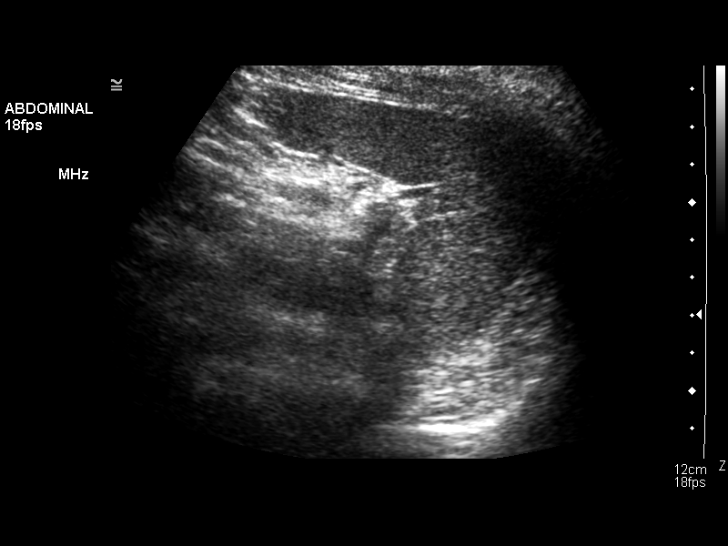
[im 79/79]
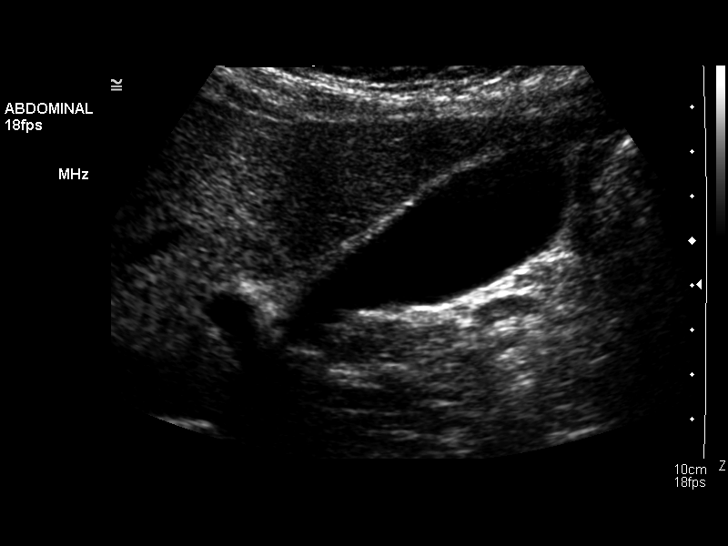

[14 of 25 positions shown; findings below may reference images not displayed]

FINDINGS: Gallbladder:  There is no evidence for gallstones.  No gallbladder
wall thickening or pericholecystic fluid.  The sonographer reports
no sonographic Murphy's sign.

Common Bile Duct:  Nondilated at 5 mm maximum diameter.

Liver:  Normal.  No focal parenchymal abnormality.  No biliary
dilation.

IVC:  Normal.

Pancreas:  Normal.

Spleen:  Normal.

Right kidney:  12.9 cm in long axis.  Normal.

Left kidney:  10.1 cm in long axis.  Normal.

Abdominal Aorta:  No aneurysm.
IMPRESSION: Normal abdominal ultrasound.

## 2014-12-09 ENCOUNTER — Inpatient Hospital Stay (HOSPITAL_COMMUNITY)
Admission: AD | Admit: 2014-12-09 | Discharge: 2014-12-11 | DRG: 989 | Disposition: A | Discharge status: Home / Self Care | Payer: Managed Care, Other (non HMO) | Source: Ambulatory Visit | Attending: Obstetrics & Gynecology | Admitting: Obstetrics & Gynecology

## 2014-12-09 ENCOUNTER — Encounter (HOSPITAL_COMMUNITY): Payer: Self-pay | Admitting: *Deleted

## 2014-12-09 DIAGNOSIS — O99824 Streptococcus B carrier state complicating childbirth: Secondary | ICD-10-CM | POA: Diagnosis present

## 2014-12-09 DIAGNOSIS — Z3A38 38 weeks gestation of pregnancy: Secondary | ICD-10-CM | POA: Diagnosis present

## 2014-12-09 DIAGNOSIS — O09529 Supervision of elderly multigravida, unspecified trimester: Secondary | ICD-10-CM

## 2014-12-09 DIAGNOSIS — O3421 Maternal care for scar from previous cesarean delivery: Secondary | ICD-10-CM | POA: Diagnosis present

## 2014-12-09 MED ORDER — DIPHENHYDRAMINE HCL 25 MG PO CAPS
25.0000 mg | ORAL_CAPSULE | Freq: Four times a day (QID) | ORAL | Status: DC | PRN
Start: 2014-12-09 — End: 2014-12-11

## 2014-12-09 MED ORDER — OXYTOCIN 10 UNIT/ML IJ SOLN
10.0000 [IU] | Freq: Once | INTRAMUSCULAR | Status: AC
  Administered 2014-12-09: 10 [IU] via INTRAMUSCULAR

## 2014-12-09 MED ORDER — CITRIC ACID-SODIUM CITRATE 334-500 MG/5ML PO SOLN: 30.0000 mL | ORAL | Status: DC | PRN

## 2014-12-09 MED ORDER — LACTATED RINGERS IV SOLN
INTRAVENOUS | Status: DC
  Administered 2014-12-09: 13:00:00 via INTRAVENOUS

## 2014-12-09 MED ORDER — TETANUS-DIPHTH-ACELL PERTUSSIS 5-2.5-18.5 LF-MCG/0.5 IM SUSP: 0.5000 mL | Freq: Once | INTRAMUSCULAR | Status: DC

## 2014-12-09 MED ORDER — BUTORPHANOL TARTRATE 1 MG/ML IJ SOLN
INTRAMUSCULAR | Status: AC
  Filled 2014-12-09: qty 1

## 2014-12-09 MED ORDER — PRENATAL MULTIVITAMIN CH
1.0000 | ORAL_TABLET | Freq: Every day | ORAL | Status: DC
  Administered 2014-12-09 – 2014-12-10 (×2): 1 via ORAL
  Filled 2014-12-09 (×2): qty 1

## 2014-12-09 MED ORDER — ACETAMINOPHEN 325 MG PO TABS: 650.0000 mg | ORAL_TABLET | ORAL | Status: DC | PRN

## 2014-12-09 MED ORDER — LIDOCAINE HCL (PF) 1 % IJ SOLN
30.0000 mL | INTRAMUSCULAR | Status: DC | PRN
  Filled 2014-12-09: qty 30

## 2014-12-09 MED ORDER — LACTATED RINGERS IV SOLN: INTRAVENOUS | Status: DC

## 2014-12-09 MED ORDER — OXYCODONE-ACETAMINOPHEN 5-325 MG PO TABS
1.0000 | ORAL_TABLET | ORAL | Status: DC | PRN
  Administered 2014-12-09: 1 via ORAL
  Filled 2014-12-09: qty 1

## 2014-12-09 MED ORDER — ONDANSETRON HCL 4 MG/2ML IJ SOLN: 4.0000 mg | INTRAMUSCULAR | Status: DC | PRN

## 2014-12-09 MED ORDER — SIMETHICONE 80 MG PO CHEW
80.0000 mg | CHEWABLE_TABLET | ORAL | Status: DC | PRN
Start: 2014-12-09 — End: 2014-12-11

## 2014-12-09 MED ORDER — LACTATED RINGERS IV SOLN: 500.0000 mL | INTRAVENOUS | Status: DC | PRN

## 2014-12-09 MED ORDER — LANOLIN HYDROUS EX OINT: TOPICAL_OINTMENT | CUTANEOUS | Status: DC | PRN

## 2014-12-09 MED ORDER — LIDOCAINE HCL (PF) 1 % IJ SOLN
INTRAMUSCULAR | Status: AC
  Filled 2014-12-09: qty 30

## 2014-12-09 MED ORDER — BENZOCAINE-MENTHOL 20-0.5 % EX AERO
1.0000 | INHALATION_SPRAY | CUTANEOUS | Status: DC | PRN
Start: 2014-12-09 — End: 2014-12-11
  Administered 2014-12-11: 1 via TOPICAL
  Filled 2014-12-09 (×2): qty 56

## 2014-12-09 MED ORDER — OXYTOCIN BOLUS FROM INFUSION: 500.0000 mL | INTRAVENOUS | Status: DC

## 2014-12-09 MED ORDER — ZOLPIDEM TARTRATE 5 MG PO TABS
5.0000 mg | ORAL_TABLET | Freq: Every evening | ORAL | Status: DC | PRN
Start: 2014-12-09 — End: 2014-12-11

## 2014-12-09 MED ORDER — SENNOSIDES-DOCUSATE SODIUM 8.6-50 MG PO TABS
2.0000 | ORAL_TABLET | ORAL | Status: DC
  Administered 2014-12-09 – 2014-12-10 (×2): 2 via ORAL
  Filled 2014-12-09 (×2): qty 2

## 2014-12-09 MED ORDER — OXYCODONE-ACETAMINOPHEN 5-325 MG PO TABS: 2.0000 | ORAL_TABLET | ORAL | Status: DC | PRN

## 2014-12-09 MED ORDER — BUTORPHANOL TARTRATE 1 MG/ML IJ SOLN
1.0000 mg | Freq: Once | INTRAMUSCULAR | Status: AC
  Administered 2014-12-09: 1 mg via INTRAVENOUS

## 2014-12-09 MED ORDER — ONDANSETRON HCL 4 MG PO TABS: 4.0000 mg | ORAL_TABLET | ORAL | Status: DC | PRN

## 2014-12-09 MED ORDER — OXYCODONE-ACETAMINOPHEN 5-325 MG PO TABS: 1.0000 | ORAL_TABLET | ORAL | Status: DC | PRN

## 2014-12-09 MED ORDER — IBUPROFEN 600 MG PO TABS
600.0000 mg | ORAL_TABLET | Freq: Four times a day (QID) | ORAL | Status: DC
  Administered 2014-12-09 – 2014-12-11 (×7): 600 mg via ORAL
  Filled 2014-12-09 (×7): qty 1

## 2014-12-09 MED ORDER — WITCH HAZEL-GLYCERIN EX PADS
1.0000 | MEDICATED_PAD | CUTANEOUS | Status: DC | PRN
Start: 2014-12-09 — End: 2014-12-11

## 2014-12-09 MED ORDER — OXYTOCIN 40 UNITS IN LACTATED RINGERS INFUSION - SIMPLE MED: 62.5000 mL/h | INTRAVENOUS | Status: DC

## 2014-12-09 MED ORDER — ONDANSETRON HCL 4 MG/2ML IJ SOLN: 4.0000 mg | Freq: Four times a day (QID) | INTRAMUSCULAR | Status: DC | PRN

## 2014-12-09 MED ORDER — DIBUCAINE 1 % RE OINT: 1.0000 "application " | TOPICAL_OINTMENT | RECTAL | Status: DC | PRN

## 2014-12-09 NOTE — H&P (Signed)
Angela Phelps is a 39 y.o. female, G3 P2002 at 37.5 weeks presented to [facility]U in second stage, ready to deliver  Patient Active Problem List   Diagnosis Date Noted  . Normal labor 12/09/2014  . Large for gestational age (LGA) 11/20/2011  . GBS (group B streptococcus) UTI complicating pregnancy 11/16/2011  . H/O cesarean section 11/13/2011  . AMA (advanced maternal age) multigravida 35+ 11/13/2011    Pregnancy Course: Patient entered care at 10.3 weeks.   EDC of 12/25/14 was established by EDD.     Significant prenatal events:   SP CS with baby #1 9lb 15oz, VBAC with baby #2 7lb 7oz   Last evaluation:   37.3 weeks   VE:2/50/-3 on 12/02/14  Reason for admission:  Second stage of labor  Pt States:   Contractions Frequency: 2         Contraction severity: strong         Fetal activity: +FM  OB History    Gravida Para Term Preterm AB TAB SAB Ectopic Multiple Living   3 3 2  0 0 0 0 0 0 3     Past Medical History  Diagnosis Date  . No pertinent past medical history   . Infection 2011    UTI after C/S   . Infection 2011    Yeast inf;during last prenancy   Past Surgical History  Procedure Laterality Date  . Cesarean section  2011    d/t failure to progress after 9 cm   Family History: family history is negative for Other. Social History:  reports that she has never smoked. She has never used smokeless tobacco. She reports that she does not drink alcohol or use illicit drugs.   Prenatal Transfer Tool  Maternal Diabetes: No Genetic Screening: Declined Maternal Ultrasounds/Referrals: Normal Fetal Ultrasounds or other Referrals:  None Maternal Substance Abuse:  No Significant Maternal Medications:  None Significant Maternal Lab Results: None   ROS:  See HPI above, all other systems are negative  No Known Allergies    Blood pressure 133/64, pulse 74, temperature 98.4 F (36.9 C), temperature source Oral, resp. rate 18, SpO2 99 %, unknown if currently  breastfeeding.  Maternal Exam:  Uterine Assessment: Contraction frequency is rare.  Abdomen: Gravid, non tender. Fundal height is aga.  Normal external genitalia, vulva, cervix, uterus and adnexa.  No lesions noted on exam.  Pelvis adequate for delivery.  Fetal presentation: Vertex by VE  Fetal Exam:  Monitor Surveillance : Continuous Monitoring Mode: Ultrasound.  NICHD: Category CTXs: Q 2 minutes per pt EFW   7 lbs  Physical Exam: Nursing note and vitals reviewed General: alert and cooperative She appears well nourished Psychiatric: Normal mood and affect. Her behavior is normal Head: Normocephalic Eyes: Pupils are equal, round, and reactive to light Neck: Normal range of motion Cardiovascular: RRR without murmur  Respiratory: CTAB. Effort normal  Abd: soft, non-tender, +BS, no rebound, no guarding  Genitourinary: Vagina normal  Neurological: A&Ox3 Skin: Warm and dry  Musculoskeletal: Normal range of motion  Homan's sign negative bilaterally No evidence of DVTs.  Edema: Minimal bilaterally non-pitting edema DTR: 2+ Clonus: None   Prenatal labs: ABO, Rh:  O positive Antibody:  negative Rubella:   Immune RPR:   NR HBsAg:   neg HIV:   NR GBS:  negative Sickle cell/Hgb electrophoresis:  WNL Pap:   GC:   Neg Chlamydia: neg Genetic screenings:   Glucola:  wnl  Assessment:  IUP at 37.5 weeks NICHD: Category  Membranes: SROM x 2hrs GBS negative  Plan:  Admit to L&D for expectant management of labor. Anticipate SVD Labor mgmt as ordered     Attending MD available at all times.    Kesley Mullens, CNM, MSN 12/09/2014, 2:16 PM

## 2014-12-10 LAB — CBC
HCT: 30.8 % — ABNORMAL LOW (ref 36.0–46.0)
Hemoglobin: 10.3 g/dL — ABNORMAL LOW (ref 12.0–15.0)
MCH: 32.3 pg (ref 26.0–34.0)
MCHC: 33.4 g/dL (ref 30.0–36.0)
MCV: 96.6 fL (ref 78.0–100.0)
PLATELETS: 182 10*3/uL (ref 150–400)
RBC: 3.19 MIL/uL — ABNORMAL LOW (ref 3.87–5.11)
RDW: 14.7 % (ref 11.5–15.5)
WBC: 8.4 10*3/uL (ref 4.0–10.5)

## 2014-12-10 LAB — RPR: RPR Ser Ql: NONREACTIVE

## 2014-12-10 NOTE — Lactation Note (Signed)
This note was copied from the chart of Angela Maigan Bittinger. Lactation Consultation Note  Experienced BF mother doubts her MS.  Hand expression taught with colostrum easily expressible.  Reviewed positioning of a newborn as her last BF experience was with a toddler.  Will follow-up PRN. Patient Name: Angela Phelps [id number] Date: 12/10/2014     Maternal Data    Feeding Feeding Type: Breast Fed Length of feed: 30 min  LATCH Score/Interventions Latch: Grasps breast easily, tongue down, lips flanged, rhythmical sucking.  Audible Swallowing: A few with stimulation  Type of Nipple: Everted at rest and after stimulation  Comfort (Breast/Nipple): Soft / non-tender     Hold (Positioning): Assistance needed to correctly position infant at breast and maintain latch.  LATCH Score: 8  Lactation Tools Discussed/Used     Consult Status Consult Status: PRN    Soyla Dryer 12/10/2014, 5:16 PM

## 2014-12-10 NOTE — Lactation Note (Signed)
This note was copied from the chart of Angela Phelps. Lactation Consultation Note Experienced BF mom BF her 5 yr. Old for 2 yrs. And her 2 1/38 yr old for 2 yrs. Denies any challenges. Has great everted nipples. The Rt. Areola has a skin tag on outer areola, Lt. Nipples has a skin tag on the base of the nipple. Hand expression demonstrated and noted a dot of colostrum to Lt. Breast and none noted. Mom has large breast. Gave hand pump for manual stimulation. Mom encouraged to feed baby 8-12 times/24 hours and with feeding cues. Mom encouraged to waken baby for feeds. Mom encouraged to do skin-to-skin.Referred to Baby and Me Book in Breastfeeding section Pg. 22-23 for position options and Proper latch demonstration. Educated about newborn behavior, I&O, cluster feeding, supply and demand. WH/LC brochure given w/resources, support groups and LC services.  Patient Name: Angela Phelps [id number] Date: 12/10/2014 Reason for consult: Initial assessment   Maternal Data Has patient been taught Hand Expression?: Yes Does the patient have breastfeeding experience prior to this delivery?: Yes  Feeding Feeding Type: Breast Fed  LATCH Score/Interventions       Type of Nipple: Everted at rest and after stimulation  Comfort (Breast/Nipple): Soft / non-tender     Hold (Positioning): No assistance needed to correctly position infant at breast. Intervention(s): Skin to skin;Position options;Support Pillows;Breastfeeding basics reviewed     Lactation Tools Discussed/Used Pump Review: Setup, frequency, and cleaning;Milk Storage Initiated by:: Peri Jefferson RN Date initiated:: 12/10/14   Consult Status Consult Status: PRN    Charyl Dancer 12/10/2014, 12:15 AM

## 2014-12-10 NOTE — Progress Notes (Signed)
Angela Phelps  Post Partum Day 1:S/P VBAC with repair of 2nd degree perineal and periclitoral lacerations  Subjective: Patient up ad lib, denies syncope or dizziness. Reports consuming regular diet without issues and denies N/V. Denies issues with urination and reports bleeding is "still there and I am having small clots."  Patient is breastfeeding and reports good infant latch, but "milk is not coming."  Desires NFP for postpartum contraception.  Pain is being appropriately managed with use of percocet and ibuprofen.  Patient declines early discharge today.  Objective: Filed Vitals:   12/09/14 1530 12/09/14 1930 12/10/14 0300 12/10/14 0557  BP: 130/60 112/51 116/56 117/69  Pulse: 71 71 65 72  Temp: 98.8 F (37.1 C) 98.6 F (37 C) 98.1 F (36.7 C) 98 F (36.7 C)  TempSrc: Oral Oral Oral Oral  Resp: SpO2:        Recent Labs  12/10/14 0540  HGB 10.3*  HCT 30.8*    Physical Exam:  General: alert, cooperative and no distress Mood/Affect: Appropriate/Appropriate Lungs: clear to auscultation, no wheezes, rales or rhonchi, symmetric air entry.  Heart: normal rate and regular rhythm. Breast: not examined. Abdomen:  + bowel sounds, Soft, NT Uterine Fundus: firm at Umbilicus Lochia: appropriate Laceration: Not Examined Skin: Warm, Dry DVT Evaluation: No evidence of DVT seen on physical exam. No cords or calf tenderness. No significant calf/ankle edema.  Assessment S/P Vaginal Delivery-Day 1 Normal [facility] BreastFeeding   Plan: Educated on milk production and colostrum; Encouraged to continue to latch infant and await milk let down in 2-3 days PP Encouraged to continue pain medication  Encouraged to Ambulate   Plan for discharge tomorrow Continue current care Dr. AVS to be updated on patient status   Angela Phelps, Angela LYNN, MSN, CNM 12/10/2014, 7:48 AM

## 2014-12-11 NOTE — Discharge Instructions (Signed)
Postpartum Depression and Baby Blues °The postpartum period begins right after the birth of a baby. During this time, there is often a great amount of joy and excitement. It is also a time of many changes in the life of the parents. Regardless of how many times a mother gives birth, each child brings new challenges and dynamics to the family. It is not unusual to have feelings of excitement along with confusing shifts in moods, emotions, and thoughts. All mothers are at risk of developing postpartum depression or the "baby blues." These mood changes can occur right after giving birth, or they may occur many months after giving birth. The baby blues or postpartum depression can be mild or severe. Additionally, postpartum depression can go away rather quickly, or it can be a long-term condition.  °CAUSES °Raised hormone levels and the rapid drop in those levels are thought to be a main cause of postpartum depression and the baby blues. A number of hormones change during and after pregnancy. Estrogen and progesterone usually decrease right after the delivery of your baby. The levels of thyroid hormone and various cortisol steroids also rapidly drop. Other factors that play a role in these mood changes include major life events and genetics.  °RISK FACTORS °If you have any of the following risks for the baby blues or postpartum depression, know what symptoms to watch out for during the postpartum period. Risk factors that may increase the likelihood of getting the baby blues or postpartum depression include: °· Having a personal or family history of depression.   °· Having depression while being pregnant.   °· Having premenstrual mood issues or mood issues related to oral contraceptives. °· Having a lot of life stress.   °· Having marital conflict.   °· Lacking a social support network.   °· Having a baby with special needs.   °· Having health problems, such as diabetes.   °SIGNS AND SYMPTOMS °Symptoms of baby blues  include: °· Brief changes in mood, such as going from extreme happiness to sadness. °· Decreased concentration.   °· Difficulty sleeping.   °· Crying spells, tearfulness.   °· Irritability.   °· Anxiety.   °Symptoms of postpartum depression typically begin within the first month after giving birth. These symptoms include: °· Difficulty sleeping or excessive sleepiness.   °· Marked weight loss.   °· Agitation.   °· Feelings of worthlessness.   °· Lack of interest in activity or food.   °Postpartum psychosis is a very serious condition and can be dangerous. Fortunately, it is rare. Displaying any of the following symptoms is cause for immediate medical attention. Symptoms of postpartum psychosis include:  °· Hallucinations and delusions.   °· Bizarre or disorganized behavior.   °· Confusion or disorientation.   °DIAGNOSIS  °A diagnosis is made by an evaluation of your symptoms. There are no medical or lab tests that lead to a diagnosis, but there are various questionnaires that a health care provider may use to identify those with the baby blues, postpartum depression, or psychosis. Often, a screening tool called the Edinburgh Postnatal Depression Scale is used to diagnose depression in the postpartum period.  °TREATMENT °The baby blues usually goes away on its own in 1-2 weeks. Social support is often all that is needed. You will be encouraged to get adequate sleep and rest. Occasionally, you may be given medicines to help you sleep.  °Postpartum depression requires treatment because it can last several months or longer if it is not treated. Treatment may include individual or group therapy, medicine, or both to address any social, physiological, and psychological   factors that may play a role in the depression. Regular exercise, a healthy diet, rest, and social support may also be strongly recommended.  Postpartum psychosis is more serious and needs treatment right away. Hospitalization is often needed. HOME CARE  INSTRUCTIONS  Get as much rest as you can. Nap when the baby sleeps.   Exercise regularly. Some women find yoga and walking to be beneficial.   Eat a balanced and nourishing diet.   Do little things that you enjoy. Have a cup of tea, take a bubble bath, read your favorite magazine, or listen to your favorite music.  Avoid alcohol.   Ask for help with household chores, cooking, grocery shopping, or running errands as needed. Do not try to do everything.   Talk to people close to you about how you are feeling. Get support from your partner, family members, friends, or other new moms.  Try to stay positive in how you think. Think about the things you are grateful for.   Do not spend a lot of time alone.   Only take over-the-counter or prescription medicine as directed by your health care provider.  Keep all your postpartum appointments.   Let your health care provider know if you have any concerns.  SEEK MEDICAL CARE IF: You are having a reaction to or problems with your medicine. SEEK IMMEDIATE MEDICAL CARE IF:  You have suicidal feelings.   You think you may harm the baby or someone else. MAKE SURE YOU:  Understand these instructions.  Will watch your condition.  Will get help right away if you are not doing well or get worse. Document Released: 12/15/2003 Document Revised: 03/17/2013 Document Reviewed: 12/22/2012 [facility] Patient Information 2015 [place], [place]. This information is not intended to replace advice given to you by your health care provider. Make sure you discuss any questions you have with your health care provider. Contraception Choices Contraception (birth control) is the use of any methods or devices to prevent pregnancy. Below are some methods to help avoid pregnancy. HORMONAL METHODS   Contraceptive implant. This is a thin, plastic tube containing progesterone hormone. It does not contain estrogen hormone. Your health care provider inserts  the tube in the inner part of the upper arm. The tube can remain in place for up to 3 years. After 3 years, the implant must be removed. The implant prevents the ovaries from releasing an egg (ovulation), thickens the cervical mucus to prevent sperm from entering the uterus, and thins the lining of the inside of the uterus.  Progesterone-only injections. These injections are given every 3 months by your health care provider to prevent pregnancy. This synthetic progesterone hormone stops the ovaries from releasing eggs. It also thickens cervical mucus and changes the uterine lining. This makes it harder for sperm to survive in the uterus.  Birth control pills. These pills contain estrogen and progesterone hormone. They work by preventing the ovaries from releasing eggs (ovulation). They also cause the cervical mucus to thicken, preventing the sperm from entering the uterus. Birth control pills are prescribed by a health care provider.Birth control pills can also be used to treat heavy periods.  Minipill. This type of birth control pill contains only the progesterone hormone. They are taken every day of each month and must be prescribed by your health care provider.  Birth control patch. The patch contains hormones similar to those in birth control pills. It must be changed once a week and is prescribed by a health care provider.  Vaginal ring. The ring contains hormones similar to those in birth control pills. It is left in the vagina for 3 weeks, removed for 1 week, and then a new one is put back in place. The patient must be comfortable inserting and removing the ring from the vagina. A health care provider's prescription is necessary. °· Emergency contraception. Emergency contraceptives prevent pregnancy after unprotected sexual intercourse. This pill can be taken right after sex or up to 5 days after unprotected sex. It is most effective the sooner you take the pills after having sexual intercourse.  Most emergency contraceptive pills are available without a prescription. Check with your pharmacist. Do not use emergency contraception as your only form of birth control. °BARRIER METHODS  °· Female condom. This is a thin sheath (latex or rubber) that is worn over the penis during sexual intercourse. It can be used with spermicide to increase effectiveness. °· Female condom. This is a soft, loose-fitting sheath that is put into the vagina before sexual intercourse. °· Diaphragm. This is a soft, latex, dome-shaped barrier that must be fitted by a health care provider. It is inserted into the vagina, along with a spermicidal jelly. It is inserted before intercourse. The diaphragm should be left in the vagina for 6 to 8 hours after intercourse. °· Cervical cap. This is a round, soft, latex or plastic cup that fits over the cervix and must be fitted by a health care provider. The cap can be left in place for up to 48 hours after intercourse. °· Sponge. This is a soft, circular piece of polyurethane foam. The sponge has spermicide in it. It is inserted into the vagina after wetting it and before sexual intercourse. °· Spermicides. These are chemicals that kill or block sperm from entering the cervix and uterus. They come in the form of creams, jellies, suppositories, foam, or tablets. They do not require a prescription. They are inserted into the vagina with an applicator before having sexual intercourse. The process must be repeated every time you have sexual intercourse. °INTRAUTERINE CONTRACEPTION °· Intrauterine device (IUD). This is a T-shaped device that is put in a woman's uterus during a menstrual period to prevent pregnancy. There are 2 types: °· Copper IUD. This type of IUD is wrapped in copper wire and is placed inside the uterus. Copper makes the uterus and fallopian tubes produce a fluid that kills sperm. It can stay in place for 10 years. °· Hormone IUD. This type of IUD contains the hormone progestin  (synthetic progesterone). The hormone thickens the cervical mucus and prevents sperm from entering the uterus, and it also thins the uterine lining to prevent implantation of a fertilized egg. The hormone can weaken or kill the sperm that get into the uterus. It can stay in place for 3-5 years, depending on which type of IUD is used. °PERMANENT METHODS OF CONTRACEPTION °· Female tubal ligation. This is when the woman's fallopian tubes are surgically sealed, tied, or blocked to prevent the egg from traveling to the uterus. °· Hysteroscopic sterilization. This involves placing a small coil or insert into each fallopian tube. Your doctor uses a technique called hysteroscopy to do the procedure. The device causes scar tissue to form. This results in permanent blockage of the fallopian tubes, so the sperm cannot fertilize the egg. It takes about 3 months after the procedure for the tubes to become blocked. You must use another form of birth control for these 3 months. °· Female sterilization. This is when the female   has the tubes that carry sperm tied off (vasectomy). This blocks sperm from entering the vagina during sexual intercourse. After the procedure, the man can still ejaculate fluid (semen). °NATURAL PLANNING METHODS °· Natural family planning. This is not having sexual intercourse or using a barrier method (condom, diaphragm, cervical cap) on days the woman could become pregnant. °· Calendar method. This is keeping track of the length of each menstrual cycle and identifying when you are fertile. °· Ovulation method. This is avoiding sexual intercourse during ovulation. °· Symptothermal method. This is avoiding sexual intercourse during ovulation, using a thermometer and ovulation symptoms. °· Post-ovulation method. This is timing sexual intercourse after you have ovulated. °Regardless of which type or method of contraception you choose, it is important that you use condoms to protect against the transmission of  sexually transmitted infections (STIs). Talk with your health care provider about which form of contraception is most appropriate for you. °Document Released: 03/12/2005 Document Revised: 03/17/2013 Document Reviewed: 09/04/2012 °[employer]® Patient Information ©2015 [employer]. This information is not intended to replace advice given to you by your health care provider. Make sure you discuss any questions you have with your health care provider. °Iron-Rich Diet °An iron-rich diet contains foods that are good sources of iron. Iron is an important mineral that helps your body produce hemoglobin. Hemoglobin is a protein in red blood cells that carries oxygen to the body's tissues. Sometimes, the iron level in your blood can be low. This may be caused by: °· A lack of iron in your diet. °· Blood loss. °· Times of growth, such as during pregnancy or during a child's growth and development. °Low levels of iron can cause a decrease in the number of red blood cells. This can result in iron deficiency anemia. Iron deficiency anemia symptoms include: °· Tiredness. °· Weakness. °· Irritability. °· Increased chance of infection. °Here are some recommendations for daily iron intake: °· Males older than 38 years of age need 8 mg of iron per day. °· Women ages 19 to 50 need 18 mg of iron per day. °· Pregnant women need 27 mg of iron per day, and women who are over 19 years of age and breastfeeding need 9 mg of iron per day. °· Women over the age of 50 need 8 mg of iron per day. °SOURCES OF IRON °There are 2 types of iron that are found in food: heme iron and nonheme iron. Heme iron is absorbed by the body better than nonheme iron. Heme iron is found in meat, poultry, and fish. Nonheme iron is found in grains, beans, and vegetables. °Heme Iron Sources °Food / Iron (mg) °· Chicken liver, 3 oz (85 g)/ 10 mg °· Beef liver, 3 oz (85 g)/ 5.5 mg °· Oysters, 3 oz (85 g)/ 8 mg °· Beef, 3 oz (85 g)/ 2 to 3 mg °· Shrimp, 3 oz (85 g)/ 2.8  mg °· Turkey, 3 oz (85 g)/ 2 mg °· Chicken, 3 oz (85 g) / 1 mg °· Fish (tuna, halibut), 3 oz (85 g)/ 1 mg °· Pork, 3 oz (85 g)/ 0.9 mg °Nonheme Iron Sources °Food / Iron (mg) °· Ready-to-eat breakfast cereal, iron-fortified / 3.9 to 7 mg °· Tofu, ½ cup / 3.4 mg °· Kidney beans, ½ cup / 2.6 mg °· Baked potato with skin / 2.7 mg °· Asparagus, ½ cup / 2.2 mg °· Avocado / 2 mg °· Dried peaches, ½ cup / 1.6 mg °· Raisins, ½ cup / 1.5   mg °· Soy milk, 1 cup / 1.5 mg °· Whole-wheat bread, 1 slice / 1.2 mg °· Spinach, 1 cup / 0.8 mg °· Broccoli, ½ cup / 0.6 mg °IRON ABSORPTION °Certain foods can decrease the body's absorption of iron. Try to avoid these foods and beverages while eating meals with iron-containing foods: °· Coffee. °· Tea. °· Fiber. °· Soy. °Foods containing vitamin C can help increase the amount of iron your body absorbs from iron sources, especially from nonheme sources. Eat foods with vitamin C along with iron-containing foods to increase your iron absorption. Foods that are high in vitamin C include many fruits and vegetables. Some good sources are: °· Fresh orange juice. °· Oranges. °· Strawberries. °· Mangoes. °· Grapefruit. °· Red bell peppers. °· Green bell peppers. °· Broccoli. °· Potatoes with skin. °· Tomato juice. °Document Released: 10/24/2004 Document Revised: 06/04/2011 Document Reviewed: 08/31/2010 °[employer]® Patient Information ©2015 [employer]. This information is not intended to replace advice given to you by your health care provider. Make sure you discuss any questions you have with your health care provider. ° °

## 2014-12-11 NOTE — Lactation Note (Signed)
This note was copied from the chart of Angela Phelps. Lactation Consultation Note  Experienced BF mom has baby latched to the breast when I went into room. States she is feeding him a little more so he will be full for the ride home. Reports breasts are feeling a little fuller this morning. No questions at present. To call prn  Patient Name: Angela Jecenia Leamer [id number] Date: 12/11/2014 Reason for consult: Follow-up assessment   Maternal Data Formula Feeding for Exclusion: No Does the patient have breastfeeding experience prior to this delivery?: Yes  Feeding Feeding Type: Breast Fed Length of feed: 5 min  LATCH Score/Interventions Latch: Grasps breast easily, tongue down, lips flanged, rhythmical sucking.  Audible Swallowing: A few with stimulation  Type of Nipple: Everted at rest and after stimulation  Comfort (Breast/Nipple): Soft / non-tender     Hold (Positioning): No assistance needed to correctly position infant at breast.  LATCH Score: 9  Lactation Tools Discussed/Used     Consult Status Consult Status: Complete    Pamelia Hoit 12/11/2014, 12:13 PM

## 2014-12-11 NOTE — Discharge Summary (Signed)
Vaginal Delivery Discharge Summary  Angela Phelps  DOB:    11-02-1975 MRN:    [id number] CSN:    [id number]  Date of admission:                  Sept 15, 2016   Date of discharge:                   Sept 17, 2016  Procedures this admission:   SVD with repair of 2nd degree perineal and periclitoral lacerations  Date of Delivery: Sept 15, 2016  Newborn Data:  Live born female  Birth Weight: 7 lb 12.7 oz (3535 g) APGAR: 8, 9  Home with mother. Name: Unknown Circumcision Plan: Outpt  History of Present Illness:  Angela Phelps is a 39 y.o. female, G3P3003, who presents at [redacted]w[redacted]d weeks gestation. The patient has been followed at [facility] Gy[facility]cology division of [facility]. She was admitted onset of labor. Her pregnancy has been complicated by:  Patient Active Problem List   Diagnosis Date Noted  . Normal labor 12/09/2014  . Normal vaginal delivery 12/09/2014  . Large for gestational age (LGA) 11/20/2011  . GBS (group B streptococcus) UTI complicating pregnancy 11/16/2011  . H/O cesarean section 11/13/2011  . AMA (advanced maternal age) multigravida 35+ 11/13/2011     Hospital Course:  Admitted in 2nd stage of labor and delivered in the MAU. Positive GBS that was untreated.  Utilized coping for pain management.  Delivery was performed by V. Standard without complication. Patient and baby tolerated the procedure without difficulty, with  2nd degree and periclitorial laceration noted. Infant status was stable and remained in room with mother.  Mother and infant then had an uncomplicated postpartum course, with breast feeding going well. Mom's physical exam was WNL, and she was discharged home in stable condition. Contraception plan was NFP.  She received adequate benefit from po pain medications.   Feeding:  breast  Contraception:  natural family planning (NFP)  Hemoglobin Results:  CBC Latest Ref Rng 12/10/2014 05/15/2012  05/14/2012  WBC 4.0 - 10.5 K/uL 8.4 12.1(H) 9.7  Hemoglobin 12.0 - 15.0 g/dL 10.3(L) 9.2(L) 11.8(L)  Hematocrit 36.0 - 46.0 % 30.8(L) 27.8(L) 36.0  Platelets 150 - 400 K/uL 182 258 266     Discharge Physical Exam:   General: alert, cooperative and no distress Mood/Affect: Appropriate/Appropriate Chest: clear to auscultation, no wheezes, rales or rhonchi, symmetric air entry.  CVS exam: normal rate and regular rhythm. Breast exam: breasts appear normal, no suspicious masses, no skin or nipple changes or axillary nodes. Abdomen:  + bowel sounds, Soft, NT Uterine Fundus: firm Lochia: appropriate Laceration: Not examined DVT Evaluation: No evidence of DVT seen on physical exam. Skin exam: Warm, Dry.  Procedures &/or Complications: Intrapartum Procedures: spontaneous vaginal delivery Postpartum Procedures: none Complications-Operative and Postpartum: 2nd degree perineal laceration and periclitoral laceration   Discharge Information:  Diagnoses: Term Pregnancy-delivered Activity:  pelvic rest Diet:   routine Medications: Instructed to continue PNV, Patient refused RX for ibuprofen and stool softeners Condition: stable Instructions:  Pain Management, Peri-Care, Breastfeeding, Who and When to call for postpartum complications. Information Sheet(s) given PPD&BB Discharge to: home  Follow-up Information    Follow up with [facility]. Schedule an appointment as soon as possible for a visit in 6 weeks.   Specialty:  Obstetrics and Gynecology   Why:  Please call if you have any questions or concerns, prior to  your next visit.   Contact information:   [address] [place] [address] [phone]       Marlene Bast MSN, CNM 12/11/2014 8:28 AM
